# Patient Record
Sex: Male | Born: 1945 | Race: White | Hispanic: No | Marital: Married | Smoking: Never smoker
Health system: Southern US, Community
[De-identification: ages and names within clinical notes are randomized; demographics above are authoritative.]

## PROBLEM LIST (undated history)

## (undated) DIAGNOSIS — I499 Cardiac arrhythmia, unspecified: Secondary | ICD-10-CM

## (undated) DIAGNOSIS — I4891 Unspecified atrial fibrillation: Secondary | ICD-10-CM

## (undated) DIAGNOSIS — M1990 Unspecified osteoarthritis, unspecified site: Secondary | ICD-10-CM

## (undated) DIAGNOSIS — I639 Cerebral infarction, unspecified: Secondary | ICD-10-CM

## (undated) DIAGNOSIS — I10 Essential (primary) hypertension: Secondary | ICD-10-CM

## (undated) DIAGNOSIS — D649 Anemia, unspecified: Secondary | ICD-10-CM

## (undated) DIAGNOSIS — I209 Angina pectoris, unspecified: Secondary | ICD-10-CM

## (undated) DIAGNOSIS — R0600 Dyspnea, unspecified: Secondary | ICD-10-CM

## (undated) HISTORY — PX: CARPAL TUNNEL RELEASE: SHX101

---

## 1998-01-24 HISTORY — PX: HERNIA REPAIR: SHX51

## 2013-06-30 ENCOUNTER — Ambulatory Visit: Payer: Self-pay | Admitting: Emergency Medicine

## 2015-12-11 ENCOUNTER — Encounter: Payer: Self-pay | Admitting: Emergency Medicine

## 2015-12-11 ENCOUNTER — Observation Stay: Payer: Medicare HMO

## 2015-12-11 ENCOUNTER — Emergency Department: Payer: Medicare HMO

## 2015-12-11 ENCOUNTER — Observation Stay
Admission: EM | Admit: 2015-12-11 | Discharge: 2015-12-12 | Disposition: A | Discharge status: Home / Self Care | Payer: Medicare HMO | Attending: Internal Medicine | Admitting: Internal Medicine

## 2015-12-11 DIAGNOSIS — Z7901 Long term (current) use of anticoagulants: Secondary | ICD-10-CM | POA: Insufficient documentation

## 2015-12-11 DIAGNOSIS — Z66 Do not resuscitate: Secondary | ICD-10-CM | POA: Insufficient documentation

## 2015-12-11 DIAGNOSIS — I10 Essential (primary) hypertension: Secondary | ICD-10-CM | POA: Diagnosis not present

## 2015-12-11 DIAGNOSIS — G459 Transient cerebral ischemic attack, unspecified: Secondary | ICD-10-CM | POA: Diagnosis not present

## 2015-12-11 DIAGNOSIS — I340 Nonrheumatic mitral (valve) insufficiency: Secondary | ICD-10-CM | POA: Diagnosis not present

## 2015-12-11 DIAGNOSIS — I482 Chronic atrial fibrillation: Secondary | ICD-10-CM | POA: Insufficient documentation

## 2015-12-11 DIAGNOSIS — Z823 Family history of stroke: Secondary | ICD-10-CM | POA: Insufficient documentation

## 2015-12-11 DIAGNOSIS — R509 Fever, unspecified: Secondary | ICD-10-CM

## 2015-12-11 DIAGNOSIS — R200 Anesthesia of skin: Secondary | ICD-10-CM | POA: Diagnosis present

## 2015-12-11 DIAGNOSIS — I429 Cardiomyopathy, unspecified: Secondary | ICD-10-CM | POA: Diagnosis not present

## 2015-12-11 DIAGNOSIS — N179 Acute kidney failure, unspecified: Secondary | ICD-10-CM | POA: Diagnosis not present

## 2015-12-11 DIAGNOSIS — Z7982 Long term (current) use of aspirin: Secondary | ICD-10-CM | POA: Diagnosis not present

## 2015-12-11 DIAGNOSIS — Z8249 Family history of ischemic heart disease and other diseases of the circulatory system: Secondary | ICD-10-CM | POA: Diagnosis not present

## 2015-12-11 HISTORY — DX: Essential (primary) hypertension: I10

## 2015-12-11 HISTORY — DX: Unspecified atrial fibrillation: I48.91

## 2015-12-11 LAB — URINALYSIS COMPLETE WITH MICROSCOPIC (ARMC ONLY)
BACTERIA UA: NONE SEEN
Bacteria, UA: NONE SEEN
Bilirubin Urine: NEGATIVE
Bilirubin Urine: NEGATIVE
Glucose, UA: NEGATIVE mg/dL
Glucose, UA: NEGATIVE mg/dL
Hgb urine dipstick: NEGATIVE
KETONES UR: NEGATIVE mg/dL
Ketones, ur: NEGATIVE mg/dL
Leukocytes, UA: NEGATIVE
Leukocytes, UA: NEGATIVE
Nitrite: NEGATIVE
Nitrite: NEGATIVE
PROTEIN: NEGATIVE mg/dL
Protein, ur: NEGATIVE mg/dL
SQUAMOUS EPITHELIAL / LPF: NONE SEEN
Specific Gravity, Urine: 1.01 (ref 1.005–1.030)
Specific Gravity, Urine: 1.002 — ABNORMAL LOW (ref 1.005–1.030)
Squamous Epithelial / HPF: NONE SEEN
pH: 6 (ref 5.0–8.0)
pH: 7 (ref 5.0–8.0)

## 2015-12-11 LAB — TROPONIN I: Troponin I: <0.03 ng/mL (ref <0.03)

## 2015-12-11 LAB — CBC
HCT: 43.1 % (ref 40.0–52.0)
Hemoglobin: 14.3 g/dL (ref 13.0–18.0)
MCH: 31.2 pg (ref 26.0–34.0)
MCHC: 33.1 g/dL (ref 32.0–36.0)
MCV: 94.2 fL (ref 80.0–100.0)
Platelets: 222 K/uL (ref 150–440)
RBC: 4.57 MIL/uL (ref 4.40–5.90)
RDW: 13.4 % (ref 11.5–14.5)
WBC: 6.4 K/uL (ref 3.8–10.6)

## 2015-12-11 LAB — BASIC METABOLIC PANEL WITH GFR
Anion gap: 5 (ref 5–15)
BUN: 15 mg/dL (ref 6–20)
CO2: 27 mmol/L (ref 22–32)
Calcium: 8.8 mg/dL — ABNORMAL LOW (ref 8.9–10.3)
Chloride: 106 mmol/L (ref 101–111)
Creatinine, Ser: 1.3 mg/dL — ABNORMAL HIGH (ref 0.61–1.24)
GFR calc Af Amer: >60 mL/min
GFR calc non Af Amer: 54 mL/min — ABNORMAL LOW
Glucose, Bld: 91 mg/dL (ref 65–99)
Potassium: 4.2 mmol/L (ref 3.5–5.1)
Sodium: 138 mmol/L (ref 135–145)

## 2015-12-11 LAB — PROTIME-INR
INR: 2.1
Prothrombin Time: 23.9 seconds — ABNORMAL HIGH (ref 11.4–15.2)

## 2015-12-11 LAB — APTT: aPTT: 36 s (ref 24–36)

## 2015-12-11 MED ORDER — OXYBUTYNIN CHLORIDE 5 MG PO TABS
5.0000 mg | ORAL_TABLET | Freq: Once | ORAL | Status: AC
  Administered 2015-12-11: 22:00:00 5 mg via ORAL
  Filled 2015-12-11: qty 1

## 2015-12-11 MED ORDER — STROKE: EARLY STAGES OF RECOVERY BOOK
Freq: Once | Status: AC
  Administered 2015-12-11: 17:00:00

## 2015-12-11 MED ORDER — LISINOPRIL 20 MG PO TABS
20.0000 mg | ORAL_TABLET | Freq: Every day | ORAL | Status: DC
  Administered 2015-12-11: 17:00:00 20 mg via ORAL
  Filled 2015-12-11 (×2): qty 1

## 2015-12-11 MED ORDER — HYDRALAZINE HCL 20 MG/ML IJ SOLN: 10.0000 mg | Freq: Four times a day (QID) | INTRAMUSCULAR | Status: DC | PRN

## 2015-12-11 MED ORDER — RIVAROXABAN 20 MG PO TABS
20.0000 mg | ORAL_TABLET | Freq: Every day | ORAL | Status: DC
  Filled 2015-12-11: qty 1

## 2015-12-11 MED ORDER — ASPIRIN 300 MG RE SUPP: 300.0000 mg | Freq: Every day | RECTAL | Status: DC

## 2015-12-11 MED ORDER — SODIUM CHLORIDE 0.9 % IV SOLN
INTRAVENOUS | Status: DC
  Administered 2015-12-11 – 2015-12-12 (×2): via INTRAVENOUS

## 2015-12-11 MED ORDER — HYDRALAZINE HCL 20 MG/ML IJ SOLN
10.0000 mg | Freq: Once | INTRAMUSCULAR | Status: AC
  Administered 2015-12-11: 10 mg via INTRAVENOUS
  Filled 2015-12-11: qty 1

## 2015-12-11 MED ORDER — ENOXAPARIN SODIUM 40 MG/0.4ML ~~LOC~~ SOLN: 40.0000 mg | SUBCUTANEOUS | Status: DC

## 2015-12-11 MED ORDER — SALINE SPRAY 0.65 % NA SOLN
1.0000 | NASAL | Status: DC | PRN
  Administered 2015-12-11: 22:00:00 1 via NASAL
  Filled 2015-12-11: qty 44

## 2015-12-11 MED ORDER — SENNOSIDES-DOCUSATE SODIUM 8.6-50 MG PO TABS: 1.0000 | ORAL_TABLET | Freq: Every evening | ORAL | Status: DC | PRN

## 2015-12-11 MED ORDER — ASPIRIN 325 MG PO TABS
325.0000 mg | ORAL_TABLET | Freq: Every day | ORAL | Status: DC
  Administered 2015-12-12: 12:00:00 325 mg via ORAL
  Filled 2015-12-11: qty 1

## 2015-12-11 MED ORDER — ASPIRIN 81 MG PO CHEW
324.0000 mg | CHEWABLE_TABLET | Freq: Once | ORAL | Status: AC
  Administered 2015-12-11: 324 mg via ORAL
  Filled 2015-12-11: qty 4

## 2015-12-11 NOTE — Progress Notes (Signed)
ANTICOAGULATION CONSULT NOTE - Initial Consult  Pharmacy Consult for Rivaroxaban (Xarelto) Indication: atrial fibrillation  No Known Allergies  Patient Measurements: Height: 5\' 7"  (170.2 cm) Weight: 197 lb (89.4 kg) IBW/kg (Calculated) : 66.1 Heparin Dosing Weight:  Vital Signs: Temp: 97.6 F (36.4 C) (11/17 1610) Temp Source: Oral (11/17 1215) BP: 151/101 (11/17 1610) Pulse Rate: 64 (11/17 1610)  Labs:  Recent Labs  12/11/15 1224  HGB 14.3  HCT 43.1  PLT 222  APTT 36  LABPROT 23.9*  INR 2.10  CREATININE 1.30*  TROPONINI <0.03    Estimated Creatinine Clearance: 56.4 mL/min (by C-G formula based on SCr of 1.3 mg/dL (H)).   Medical History: Past Medical History:  Diagnosis Date  . Atrial fibrillation (HCC)   . Hypertension     Medications:  Scheduled:  .  stroke: mapping our early stages of recovery book   Does not apply Once  . aspirin  300 mg Rectal Daily   Or  . aspirin  325 mg Oral Daily  . hydrALAZINE  10 mg Intravenous Once  . lisinopril  20 mg Oral Daily  . rivaroxaban  20 mg Oral Q supper    Assessment: 70 yo M admitted with L arm weakness, numbness- evaluate for TIA.  Hx Chronic Afib on Xarelto 20mg  daily at home. Patient Crcl with Total body weight of 89.4kg = 67 ml/min.  Plan:  Will continue patient on Xarelto (Rivaroxaban) 20mg  po daily with evening meal.  (Note that Rivaroxaban will affect the INR result, but the INR is not used to dose medication)   Meaghen Vecchiarelli A 12/11/2015,4:57 PM

## 2015-12-11 NOTE — ED Provider Notes (Signed)
[facility] Emergency Department Provider Note  ____________________________________________  Time seen: Approximately 12:48 PM  I have reviewed the triage vital signs and the nursing notes.   HISTORY  Chief Complaint Numbness and Dizziness   HPI Thomas KillingsJohn G Newman is a 70 y.o. male history of atrial fibrillation on Xarelto, hypertension, migraineswho presents for evaluation of left sided numbness. Patient reports multiple weekly episodes of left upper and lower extremity numbness and weakness. Patient reports that these episodes have been going on for 2-3 weeks. The first one lasted about 5 minutes and all the following once last a few minutes at a time. They're associated with dizziness but no headache. Patient reports that the last episode was earlier this morning. Had an appointment with Dr. Sherryll BurgerShah, neurology who sent him to the emergency room for further evaluation as patient's last episode was this am at 10:30. Patient reports no numbness or weakness at this time. Patient denies personal or family history of strokes. He is not a smoker. Patient denies facial droop, slurred speech with these episodes.  Past Medical History:  Diagnosis Date  . Atrial fibrillation (HCC)   . Hypertension     There are no active problems to display for this patient.   Past Surgical History:  Procedure Laterality Date  . CARPAL TUNNEL RELEASE    . HERNIA REPAIR      Prior to Admission medications   Not on File    Allergies Patient has no known allergies.  History reviewed. No pertinent family history.  Social History Social History  Substance Use Topics  . Smoking status: Never Smoker  . Smokeless tobacco: Never Used  . Alcohol use Yes    Review of Systems  Constitutional: Negative for fever. Eyes: Negative for visual changes. ENT: Negative for sore throat. Neck: No neck pain  Cardiovascular: Negative for chest pain. Respiratory: Negative for shortness of  breath. Gastrointestinal: Negative for abdominal pain, vomiting or diarrhea. Genitourinary: Negative for dysuria. Musculoskeletal: Negative for back pain. Skin: Negative for rash. Neurological: Negative for headaches. + dizziness, LU and LLE weakness and numbness Psych: No SI or HI  ____________________________________________   PHYSICAL EXAM:  VITAL SIGNS: ED Triage Vitals [12/11/15 1215]  Enc Vitals Group     BP (!) 182/89     Pulse Rate (!) 39     Resp 18     Temp 97.9 F (36.6 C)     Temp Source Oral     SpO2 98 %     Weight 197 lb (89.4 kg)     Height 5\' 7"  (1.702 m)     Head Circumference      Peak Flow      Pain Score      Pain Loc      Pain Edu?      Excl. in GC?     Constitutional: Alert and oriented. Well appearing and in no apparent distress. HEENT:      Head: Normocephalic and atraumatic.         Eyes: Conjunctivae are normal. Sclera is non-icteric. EOMI. PERRL      Mouth/Throat: Mucous membranes are moist.       Neck: Supple with no signs of meningismus. Cardiovascular: Regular rate and rhythm. No murmurs, gallops, or rubs. 2+ symmetrical distal pulses are present in all extremities. No JVD. Respiratory: Normal respiratory effort. Lungs are clear to auscultation bilaterally. No wheezes, crackles, or rhonchi.  Gastrointestinal: Soft, non tender, and non distended with positive bowel sounds.  No rebound or guarding. Musculoskeletal: Nontender with normal range of motion in all extremities. No edema, cyanosis, or erythema of extremities. Neurologic: Normal speech and language. A & O x3, PERRL, no nystagmus, CN II-XII intact, motor testing reveals good tone and bulk throughout. There is no evidence of pronator drift or dysmetria. Muscle strength is 5/5 throughout. Deep tendon reflexes are 2+ throughout with downgoing toes. Sensory examination is intact. Gait is normal. Skin: Skin is warm, dry and intact. No rash noted. Psychiatric: Mood and affect are normal.  Speech and behavior are normal.  ____________________________________________   LABS (all labs ordered are listed, but only abnormal results are displayed)  Labs Reviewed  URINALYSIS COMPLETEWITH MICROSCOPIC ([facility] ONLY) - Abnormal; Notable for the following:       Result Value   Color, Urine YELLOW (*)    APPearance CLEAR (*)    All other components within normal limits  PROTIME-INR - Abnormal; Notable for the following:    Prothrombin Time 23.9 (*)    All other components within normal limits  CBC  APTT  BASIC METABOLIC PANEL  TROPONIN I   ____________________________________________  EKG  ED ECG REPORT I, Nita Sicklearolina Faria Casella, the attending physician, personally viewed and interpreted this ECG.  Atrial fibrillation with occasional PVCs, rate of 70, normal QRS and QTc intervals, normal axis, no ST elevations or depressions.  ____________________________________________  RADIOLOGY  Head CT: negative  ____________________________________________   PROCEDURES  Procedure(s) performed: None Procedures Critical Care performed:  None ____________________________________________   INITIAL IMPRESSION / ASSESSMENT AND PLAN / ED COURSE   70 y.o. male history of atrial fibrillation on Xarelto, hypertension, migraineswho presents for evaluation of multiple weekly episodes of LU and LLE weakness and numbness with last one happening this morning at 10:30AM. Patient is currently neuro intact. Sent here by Dr. Sherryll BurgerShah neurology. Head CT with no acute findings. Will consult neurology for admission for TIA work up. Will give ASA.  Clinical Course     Pertinent labs & imaging results that were available during my care of the patient were reviewed by me and considered in my medical decision making (see chart for details).    ____________________________________________   FINAL CLINICAL IMPRESSION(S) / ED DIAGNOSES  Final diagnoses:  Transient cerebral ischemia, unspecified type        NEW MEDICATIONS STARTED DURING THIS VISIT:  New Prescriptions   No medications on file     Note:  This document was prepared using Dragon voice recognition software and may include unintentional dictation errors.    Nita Sicklearolina Ahmira Boisselle, MD 12/11/15 1316

## 2015-12-11 NOTE — H&P (Addendum)
[facility] [place] [facility]   PATIENT NAME: Thomas Newman    MR#:  [id number]  DATE OF BIRTH:  09/03/1945  DATE OF ADMISSION:  12/11/2015  PRIMARY CARE PHYSICIAN: BLISS, Doreene Nest, MD   REQUESTING/REFERRING PHYSICIAN:  Dr Don Perking  CHIEF COMPLAINT:   Left hand numbness with feelings of lightheadedness HISTORY OF PRESENT ILLNESS:  Gwyn Mehring  is a 70 y.o. male with a known history of  Atrial fibrillation on anticoagulation who presents with above complaint. Over the past 3 weeks patient has had several episodes of left hand numbness with feelings of lightheadedness. He has also had some spells of left lower extremity numbness and weakness. There are no other neurological deficits such as a facial or facial droop. These episodes last 2-5 minutes. They have occurred 2-3 times per week. He is compliant with his medications including XARELTO which she started several months ago for chronic atrial fibrillation. Patient reported this to his cardiologist approximately a week ago and was referred to neurologist for further evaluation. Patient was evaluated by neurologist this morning and was sent to the emergency room for further evaluation. Patient has no symptoms while in the emergency room however does report that this a.m. he had numbness in the left arm. He denies trauma to the cervical neck or recent falls.  PAST MEDICAL HISTORY:   Past Medical History:  Diagnosis Date  . Atrial fibrillation (HCC)   . Hypertension     PAST SURGICAL HISTORY:   Past Surgical History:  Procedure Laterality Date  . CARPAL TUNNEL RELEASE    . HERNIA REPAIR      SOCIAL HISTORY:   Social History  Substance Use Topics  . Smoking status: Never Smoker  . Smokeless tobacco: Never Used  . Alcohol use Yes    FAMILY HISTORY:   Positive for CAD and CVA DRUG ALLERGIES:  No Known Allergies  REVIEW OF SYSTEMS:   Review of Systems  Constitutional: Negative.  Negative for chills,  fever and malaise/fatigue.  HENT: Negative.  Negative for ear discharge, ear pain, hearing loss, nosebleeds and sore throat.   Eyes: Negative.  Negative for blurred vision and pain.  Respiratory: Negative.  Negative for cough, hemoptysis, shortness of breath and wheezing.   Cardiovascular: Negative.  Negative for chest pain, palpitations and leg swelling.  Gastrointestinal: Negative.  Negative for abdominal pain, blood in stool, diarrhea, nausea and vomiting.  Genitourinary: Negative.  Negative for dysuria.  Musculoskeletal: Negative.  Negative for back pain.  Skin: Negative.   Neurological: Positive for sensory change and focal weakness. Negative for dizziness, tremors, speech change, seizures and headaches.  Endo/Heme/Allergies: Negative.  Does not bruise/bleed easily.  Psychiatric/Behavioral: Negative.  Negative for depression, hallucinations and suicidal ideas.    MEDICATIONS AT HOME:   Prior to Admission medications   Medication Sig Start Date End Date Taking? Authorizing Provider  Multiple Vitamins-Minerals (SENIOR VITES) TBCR Take 1 tablet by mouth daily.   Yes Historical Provider, MD  Omega-3 Fatty Acids (FISH OIL PO) Take 1,400 tablets by mouth daily. 08/12/08  Yes Historical Provider, MD  rivaroxaban (XARELTO) 20 MG TABS tablet Take 20 tablets by mouth daily. 09/03/15  Yes Historical Provider, MD  aspirin EC 81 MG tablet Take 1 tablet by mouth daily.    Historical Provider, MD  cloNIDine (CATAPRES) 0.1 MG tablet Take 0.1 mg by mouth 2 (two) times daily. 12/02/15   Historical Provider, MD      VITAL SIGNS:  Blood pressure (!) 147/101,  pulse (!) 45, temperature 97.9 F (36.6 C), resp. rate 19, height 5\' 7"  (1.702 m), weight 89.4 kg (197 lb), SpO2 99 %.  PHYSICAL EXAMINATION:   Physical Exam  Constitutional: He is oriented to person, place, and time and well-developed, well-nourished, and in no distress. No distress.  HENT:  Head: Normocephalic.  Eyes: No scleral icterus.   Neck: Normal range of motion. Neck supple. No JVD present. No tracheal deviation present.  Cardiovascular: Normal rate.  Exam reveals no gallop and no friction rub.   Murmur heard. Irregular, irregular  Pulmonary/Chest: Effort normal and breath sounds normal. No respiratory distress. He has no wheezes. He has no rales. He exhibits no tenderness.  Abdominal: Soft. Bowel sounds are normal. He exhibits no distension and no mass. There is no tenderness. There is no rebound and no guarding.  Musculoskeletal: Normal range of motion. He exhibits no edema.  Neurological: He is alert and oriented to person, place, and time.  Skin: Skin is warm. No rash noted. No erythema.  Psychiatric: Affect and judgment normal.      LABORATORY PANEL:   CBC  Recent Labs Lab 12/11/15 1224  WBC 6.4  HGB 14.3  HCT 43.1  PLT 222   ------------------------------------------------------------------------------------------------------------------  Chemistries   Recent Labs Lab 12/11/15 1224  NA 138  K 4.2  CL 106  CO2 27  GLUCOSE 91  BUN 15  CREATININE 1.30*  CALCIUM 8.8*   ------------------------------------------------------------------------------------------------------------------  Cardiac Enzymes  Recent Labs Lab 12/11/15 1224  TROPONINI <0.03   ------------------------------------------------------------------------------------------------------------------  RADIOLOGY:  Ct Head Wo Contrast  Result Date: 12/11/2015 CLINICAL DATA:  Pt states numbness in left arm Pt dizzy And light headedPt states started in october EXAM: CT HEAD WITHOUT CONTRAST TECHNIQUE: Contiguous axial images were obtained from the base of the skull through the vertex without intravenous contrast. COMPARISON:  None. FINDINGS: Brain: No evidence of acute infarction, hemorrhage, hydrocephalus, extra-axial collection or mass lesion/mass effect. Vascular: No hyperdense vessel or unexpected calcification. Skull:  Normal. Negative for fracture or focal lesion. Sinuses/Orbits: Visualized upper maxillary sinuses are opacified. Ethmoid, frontal and sphenoid sinuses are clear. Clear mastoid air cells. Globes and orbits are unremarkable. Other: Small calcified left parietal scalp lesion, incidental. IMPRESSION: 1. No acute intracranial abnormalities. 2. Opacified maxillary sinuses, incompletely imaged. Electronically Signed   By: Amie Portlandavid  Ormond M.D.   On: 12/11/2015 13:02    EKG:   Atrial fibrillation without ST elevation or depression  IMPRESSION AND PLAN:   70 year old male with chronic atrial fibrillation on anticoagulation who presents with several weeks of left arm numbness and weakness.  1. Left arm numbness and weakness: Patient will be admitted to evaluate for TIA. Carotid Doppler, MRI and MRA of the brain have been ordered. Neurology consultation has been requested. I will continue outpatient medications including aspirin and Xarelto. I will also order x-ray cervical neck to exclude cervical etiology.  2. Chronic atrial fibrillation on Xarelto: Patient is followed by Dr. Juliann Paresallwood. Consideration for beta blocker or calcium channel blocker per last visit Currently heart rates in the 60's  3. Essential hypertension: Continue clonidine and will replace HCTZ with lisinopril  4. Cardiac murmur likely due to valvular regurgitation. 5. Cardiomyopathy EF of 40-45% by echocardiogram Start lisinopril. Consider beta blocker if heart rate tolerates. 6. AKI: Hold HCTZ and monitor closely while on ACEI.  All the records are reviewed and case discussed with ED provider. Management plans discussed with the patient and he in agreement  CODE STATUS: DNR TOTAL TIME  TAKING CARE OF THIS PATIENT: 45 minutes.    Gara Kincade M.D on 12/11/2015 at 2:17 PM  Between 7am to 6pm - Pager - [phone]  After 6pm go to [email or web address] - Social research officer, governmentpassword [email or web address] [email or web address]  [facility]  Office   [phone]  CC: Primary care physician; BLISS, Doreene NestLAURA K, MD

## 2015-12-11 NOTE — ED Triage Notes (Signed)
Pt reports dizziness left arm/leg numbness/tingling on and off for 2-3 weeks. Denies any pain. No hx stroke.

## 2015-12-11 NOTE — Progress Notes (Signed)
Family Meeting Note  Advance Directive:yes  Today a meeting took place with the spouse.   The following clinical team members were present during this meeting:  The following were discussed:Patient's diagnosis:  numbness r/o TIA , Patient's progosis: Unable to determine and Goals for treatment: DNR  Additional follow-up to be provided: patient is DNR   Time spent during discussion:16 minutes  Anjelika Ausburn, MD

## 2015-12-11 NOTE — Care Management Obs Status (Signed)
MEDICARE OBSERVATION STATUS NOTIFICATION   Patient Details  Name: Thomas Newman MRN: [id number] Date of Birth: 1945-03-03   Medicare Observation Status Notification Given:  Yes    Berna BueCheryl Mulan Adan, RN 12/11/2015, 2:13 PM

## 2015-12-12 ENCOUNTER — Observation Stay: Payer: Medicare HMO

## 2015-12-12 ENCOUNTER — Observation Stay
Admit: 2015-12-12 | Discharge: 2015-12-12 | Disposition: A | Discharge status: Home / Self Care | Payer: Medicare HMO | Attending: Internal Medicine | Admitting: Internal Medicine

## 2015-12-12 DIAGNOSIS — G459 Transient cerebral ischemic attack, unspecified: Secondary | ICD-10-CM | POA: Diagnosis not present

## 2015-12-12 LAB — LIPID PANEL
CHOLESTEROL: 155 mg/dL (ref 0–200)
HDL: 50 mg/dL (ref >40)
LDL Cholesterol: 92 mg/dL (ref 0–99)
Total CHOL/HDL Ratio: 3.1 RATIO
Triglycerides: 67 mg/dL (ref <150)
VLDL: 13 mg/dL (ref 0–40)

## 2015-12-12 MED ORDER — ATORVASTATIN CALCIUM 40 MG PO TABS: 40.0000 mg | ORAL_TABLET | Freq: Every day | ORAL | 0 refills | Status: DC

## 2015-12-12 MED ORDER — ACETAMINOPHEN 325 MG PO TABS
650.0000 mg | ORAL_TABLET | Freq: Four times a day (QID) | ORAL | Status: DC | PRN
  Administered 2015-12-12 (×2): 650 mg via ORAL
  Filled 2015-12-12 (×2): qty 2

## 2015-12-12 MED ORDER — CLONIDINE HCL 0.1 MG PO TABS: 0.1000 mg | ORAL_TABLET | Freq: Two times a day (BID) | ORAL | Status: DC

## 2015-12-12 MED ORDER — CLONIDINE HCL 0.1 MG PO TABS
0.2000 mg | ORAL_TABLET | Freq: Once | ORAL | Status: AC
  Administered 2015-12-12: 13:00:00 0.2 mg via ORAL
  Filled 2015-12-12: qty 2

## 2015-12-12 NOTE — Progress Notes (Signed)
*  PRELIMINARY RESULTS* Echocardiogram 2D Echocardiogram has been performed.  Thomas Newman 12/12/2015, 11:28 AM

## 2015-12-12 NOTE — Evaluation (Signed)
Occupational Therapy Evaluation Patient Details Name: Thomas Newman MRN: [id number] DOB: 1945-08-22 Today's Date: 12/12/2015    History of Present Illness 70 y.o. male pt. with a known history of  Atrial fibrillation on anticoagulation who presents with complaint - over the past 3 weeks patient has had several episodes of left hand numbness with feelings of lightheadedness. He has also had some spells of left lower extremity numbness and weakness.    Clinical Impression   Pt presents with no reported pain and no functional deficits in self care tasks and reports that symptoms have resolved. Pt's house is well set up with AE/DME and pt will have support from wife at home. No additional OT needs at this time, no equipment recommended. Will d/c patient from OT services.    Follow Up Recommendations  No OT follow up    Equipment Recommendations  None recommended by OT    Recommendations for Other Services       Precautions / Restrictions Precautions Precautions: None Restrictions Weight Bearing Restrictions: No      Mobility Bed Mobility Overal bed mobility: Independent             General bed mobility comments: Independent  Transfers Overall transfer level: Independent Equipment used: None             General transfer comment:  (Independent without AD)    Balance Overall balance assessment: Independent;No apparent balance deficits (not formally assessed)                                          ADL Overall ADL's : Independent                                       General ADL Comments: Pt able to perform all basic self care tasks independently and at baseline levels     Vision Vision Assessment?: No apparent visual deficits   Perception     Praxis      Pertinent Vitals/Pain Pain Assessment: No/denies pain     Hand Dominance     Extremity/Trunk Assessment Upper Extremity Assessment Upper Extremity  Assessment: Overall WFL for tasks assessed   Lower Extremity Assessment Lower Extremity Assessment: Overall WFL for tasks assessed   Cervical / Trunk Assessment Cervical / Trunk Assessment: Normal   Communication Communication Communication: No difficulties   Cognition Arousal/Alertness: Awake/alert Behavior During Therapy: WFL for tasks assessed/performed Overall Cognitive Status: Within Functional Limits for tasks assessed                     General Comments       Exercises       Shoulder Instructions      Home Living Family/patient expects to be discharged to:: Private residence Living Arrangements: Spouse/significant other Available Help at Discharge: Family Type of Home: House Home Access: Stairs to enter Entergy CorporationEntrance Stairs-Number of Steps: 3 steps with no railing in front; 4 steps with railing in the back   Home Layout: One level     Bathroom Shower/Tub: Walk-in shower;Curtain (built in shower seat, handheld shower head)   Bathroom Toilet: Handicapped height Bathroom Accessibility: Yes   Home Equipment: Environmental consultantWalker - 4 wheels;Bedside commode;Cane - single point;Shower seat - built in;Adaptive equipment;Hand held shower head;Grab bars - tub/shower;Grab bars -  toilet Adaptive Equipment: Reacher;Sock aid;Long-handled shoe horn        Prior Functioning/Environment Level of Independence: Independent                 OT Problem List:     OT Treatment/Interventions:      OT Goals(Current goals can be found in the care plan section) Acute Rehab OT Goals Patient Stated Goal: to go home OT Goal Formulation: With patient/family Time For Goal Achievement: 12/26/15 Potential to Achieve Goals: Good  OT Frequency:     Barriers to D/C:            Co-evaluation              End of Session    Activity Tolerance: Patient tolerated treatment well Patient left: in bed;with call bell/phone within reach   Time: [id number] OT Time Calculation (min): 8  min Charges:  OT General Charges $OT Visit: 1 Procedure OT Evaluation $OT Eval Low Complexity: 1 Procedure G-Codes: OT G-codes **NOT FOR INPATIENT CLASS** Functional Assessment Tool Used: clinical judgment Functional Limitation: Self care Self Care Current Status (V4098(G8987): 0 percent impaired, limited or restricted Self Care Goal Status (J1914(G8988): 0 percent impaired, limited or restricted Self Care Discharge Status (N8295(G8989): 0 percent impaired, limited or restricted  Eliezer BottomJamie L Stiller, OTR/L 12/12/2015, 1:02 PM

## 2015-12-12 NOTE — Progress Notes (Signed)
SLP Cancellation Note  Patient Details Name: Thomas KillingsJohn G Newman MRN: [id number] DOB: 1946-01-23   Cancelled treatment:       Reason Eval/Treat Not Completed: SLP screened, no needs identified, will sign off Reviewed chart and spoke w/nsg and pt/family. Pt is a 70 y.o. male with a known history of  Atrial fibrillation on anticoagulation who presents with several episodes of left hand numbness with feelings of lightheadedness. He has also had some spells of left lower extremity numbness and weakness. There are no other neurological deficits such as a facial or facial droop. These episodes last 2-5 minutes. They have occurred 2-3 times per week. Pt able to converse appropriately with ST. No dysarthria or word finding difficulties noted. Pt/family and nsg deny any swallowing difficulties. Pt reports he is at baseline. No Skilled ST services indicated at this time. Please re-consult if further concerns arise.   Truxton,Mishael Haran 12/12/2015, 12:09 PM

## 2015-12-12 NOTE — Consult Note (Signed)
Referring Physician: Renae Gloss    Chief Complaint: Left sided weakness/numbness  HPI: Thomas Newman is an 70 y.o. male who reports that on October 30 he began to have episodes of left sided numbness and weakness.  There is associated dizziness as well.  Symptoms appear to always involve the arm but may also involve the leg and trunk at times as well.  Episodes last only a few minutes then resolve spontaneously.  Initially they occurred about 2-3 times per week but the last two were a week apart.  Patient as to have a neurology appointment on yesterday and had an episode on the way to his appointment.  Was sent for admission on arrival.  Initial NIHSS of 0.    Date last known well: Date: 12/11/2015 Time last known well: Time: 10:30 tPA Given: No: resolution of symptoms  Past Medical History:  Diagnosis Date  . Atrial fibrillation (HCC)   . Hypertension     Past Surgical History:  Procedure Laterality Date  . CARPAL TUNNEL RELEASE    . HERNIA REPAIR      Family history: Father with HTN.  Maternal grandparents with stroke.  Paternal grandparents with CAD.  Brothers alive and well.  Two sisters with breast cancer.    Social History:  reports that he has never smoked. He has never used smokeless tobacco. He reports that he drinks alcohol. He reports that he does not use drugs.  Allergies: No Known Allergies  Medications:  I have reviewed the patient's current medications. Prior to Admission:  Prescriptions Prior to Admission  Medication Sig Dispense Refill Last Dose  . Multiple Vitamins-Minerals (SENIOR VITES) TBCR Take 1 tablet by mouth daily.   12/11/2015 at 0800  . Omega-3 Fatty Acids (FISH OIL PO) Take 1,400 tablets by mouth daily.   12/11/2015 at 0800  . rivaroxaban (XARELTO) 20 MG TABS tablet Take 20 tablets by mouth daily.   12/11/2015 at 0800  . aspirin EC 81 MG tablet Take 1 tablet by mouth daily.   12/11/2015 at 0800  . cloNIDine (CATAPRES) 0.1 MG tablet Take 0.1 mg by mouth 2  (two) times daily.   12/11/2015 at 0800   Scheduled: . aspirin  300 mg Rectal Daily   Or  . aspirin  325 mg Oral Daily  . lisinopril  20 mg Oral Daily  . rivaroxaban  20 mg Oral Q supper    ROS: History obtained from the patient  General ROS: negative for - chills, fatigue, fever, night sweats, weight gain or weight loss Psychological ROS: negative for - behavioral disorder, hallucinations, memory difficulties, mood swings or suicidal ideation Ophthalmic ROS: negative for - blurry vision, double vision, eye pain or loss of vision ENT ROS: negative for - epistaxis, nasal discharge, oral lesions, sore throat, tinnitus or vertigo Allergy and Immunology ROS: negative for - hives or itchy/watery eyes Hematological and Lymphatic ROS: negative for - bleeding problems, bruising or swollen lymph nodes Endocrine ROS: negative for - galactorrhea, hair pattern changes, polydipsia/polyuria or temperature intolerance Respiratory ROS: negative for - cough, hemoptysis, shortness of breath or wheezing Cardiovascular ROS: negative for - chest pain, dyspnea on exertion, edema or irregular heartbeat Gastrointestinal ROS: negative for - abdominal pain, diarrhea, hematemesis, nausea/vomiting or stool incontinence Genito-Urinary ROS: negative for - dysuria, hematuria, incontinence or urinary frequency/urgency Musculoskeletal ROS: negative for - joint swelling or muscular weakness Neurological ROS: as noted in HPI Dermatological ROS: negative for rash and skin lesion changes  Physical Examination: Blood pressure (!) 146/76,  pulse 60, temperature 99.2 F (37.3 C), temperature source Oral, resp. rate 18, height 5\' 7"  (1.702 m), weight 89.4 kg (197 lb), SpO2 97 %.  HEENT-  Normocephalic, no lesions, without obvious abnormality.  Normal external eye and conjunctiva.  Normal TM's bilaterally.  Normal auditory canals and external ears. Normal external nose, mucus membranes and septum.  Normal  pharynx. Cardiovascular- S1, S2 normal, pulses palpable throughout   Lungs- chest clear, no wheezing, rales, normal symmetric air entry Abdomen- soft, non-tender; bowel sounds normal; no masses,  no organomegaly Extremities- no edema Lymph-no adenopathy palpable Musculoskeletal-no joint tenderness, deformity or swelling Skin-warm and dry, no hyperpigmentation, vitiligo, or suspicious lesions  Neurological Examination Mental Status: Alert, oriented, thought content appropriate.  Speech fluent without evidence of aphasia.  Able to follow 3 step commands without difficulty. Cranial Nerves: II: Discs flat bilaterally; Visual fields grossly normal, pupils equal, round, reactive to light and accommodation III,IV, VI: ptosis not present, extra-ocular motions intact bilaterally V,VII: smile symmetric, facial light touch sensation normal bilaterally VIII: hearing normal bilaterally IX,X: gag reflex present XI: bilateral shoulder shrug XII: midline tongue extension Motor: Right : Upper extremity   5/5    Left:     Upper extremity   5-/5, no pronator drift  Lower extremity   5/5     Lower extremity   5/5 Tone and bulk:normal tone throughout; no atrophy noted Sensory: Pinprick and light touch intact throughout, bilaterally Deep Tendon Reflexes: 2+ and symmetric throughout Plantars: Right: downgoing   Left: downgoing Cerebellar: Normal finger-to-nose and normal heel-to-shin testing bilaterally Gait: normal gait and station   Laboratory Studies:  Basic Metabolic Panel:  Recent Labs Lab 12/11/15 1224  NA 138  K 4.2  CL 106  CO2 27  GLUCOSE 91  BUN 15  CREATININE 1.30*  CALCIUM 8.8*    Liver Function Tests: No results for input(s): AST, ALT, ALKPHOS, BILITOT, PROT, ALBUMIN in the last 168 hours. No results for input(s): LIPASE, AMYLASE in the last 168 hours. No results for input(s): AMMONIA in the last 168 hours.  CBC:  Recent Labs Lab 12/11/15 1224  WBC 6.4  HGB 14.3  HCT  43.1  MCV 94.2  PLT 222    Cardiac Enzymes:  Recent Labs Lab 12/11/15 1224  TROPONINI <0.03    BNP: Invalid input(s): POCBNP  CBG: No results for input(s): GLUCAP in the last 168 hours.  Microbiology: No results found for this or any previous visit.  Coagulation Studies:  Recent Labs  12/11/15 1224  LABPROT 23.9*  INR 2.10    Urinalysis:  Recent Labs Lab 12/11/15 1224 12/11/15 1901  COLORURINE YELLOW* COLORLESS*  LABSPEC 1.010 1.002*  PHURINE 6.0 7.0  GLUCOSEU NEGATIVE NEGATIVE  HGBUR NEGATIVE 2+*  BILIRUBINUR NEGATIVE NEGATIVE  KETONESUR NEGATIVE NEGATIVE  PROTEINUR NEGATIVE NEGATIVE  NITRITE NEGATIVE NEGATIVE  LEUKOCYTESUR NEGATIVE NEGATIVE    Lipid Panel:    Component Value Date/Time   CHOL 155 12/12/2015 0425   TRIG 67 12/12/2015 0425   HDL 50 12/12/2015 0425   CHOLHDL 3.1 12/12/2015 0425   VLDL 13 12/12/2015 0425   LDLCALC 92 12/12/2015 0425    HgbA1C: No results found for: HGBA1C  Urine Drug Screen:  No results found for: LABOPIA, COCAINSCRNUR, LABBENZ, AMPHETMU, THCU, LABBARB  Alcohol Level: No results for input(s): ETH in the last 168 hours.  Other results: EKG: atrial fibrillation, rate 70 bpm.  Imaging: Dg Chest 1 View  Result Date: 12/12/2015 CLINICAL DATA:  Left-sided numbness. EXAM: CHEST 1  VIEW COMPARISON:  None. FINDINGS: Mild cardiomegaly. Lungs are clear. No effusions or acute bony abnormality. IMPRESSION: Cardiomegaly.  No active disease. Electronically Signed   By: Charlett Nose M.D.   On: 12/12/2015 08:59   Dg Cervical Spine Complete  Result Date: 12/11/2015 CLINICAL DATA:  Left arm numbness.  No known injury. EXAM: CERVICAL SPINE - COMPLETE 4+ VIEW COMPARISON:  None. FINDINGS: Disc space narrowing and moderately large and large anterior spurs at the C3-4 through C6-7 levels. Milder posterior spur formation at those levels. There are also uncinate spurs bilaterally at those levels. Minimal foraminal stenosis on the right  at the C3-4 and C4-5 levels and mild to moderate foraminal stenosis on the right at the C5-6 and C6-7 levels. Mild foraminal stenosis on the left at the C3-4 level and moderate foraminal stenosis on the left at the C5-6 and C6-7 levels. No significant foraminal stenosis on the left at the C4-5 level. IMPRESSION: Multilevel cervical spine degenerative changes, as described above. There is moderate foraminal stenosis on the left at the C5-6 and C6-7 levels. Electronically Signed   By: Beckie Salts M.D.   On: 12/11/2015 14:42   Ct Head Wo Contrast  Result Date: 12/11/2015 CLINICAL DATA:  Pt states numbness in left arm Pt dizzy And light headedPt states started in october EXAM: CT HEAD WITHOUT CONTRAST TECHNIQUE: Contiguous axial images were obtained from the base of the skull through the vertex without intravenous contrast. COMPARISON:  None. FINDINGS: Brain: No evidence of acute infarction, hemorrhage, hydrocephalus, extra-axial collection or mass lesion/mass effect. Vascular: No hyperdense vessel or unexpected calcification. Skull: Normal. Negative for fracture or focal lesion. Sinuses/Orbits: Visualized upper maxillary sinuses are opacified. Ethmoid, frontal and sphenoid sinuses are clear. Clear mastoid air cells. Globes and orbits are unremarkable. Other: Small calcified left parietal scalp lesion, incidental. IMPRESSION: 1. No acute intracranial abnormalities. 2. Opacified maxillary sinuses, incompletely imaged. Electronically Signed   By: Amie Portland M.D.   On: 12/11/2015 13:02   Mr Brain Wo Contrast  Result Date: 12/12/2015 CLINICAL DATA:  Left-sided numbness. History of atrial fibrillation. EXAM: MRI HEAD WITHOUT CONTRAST MRA HEAD WITHOUT CONTRAST TECHNIQUE: Multiplanar, multiecho pulse sequences of the brain and surrounding structures were obtained without intravenous contrast. Angiographic images of the head were obtained using MRA technique without contrast. COMPARISON:  None. FINDINGS: MRI HEAD  FINDINGS Brain: No acute infarction, hemorrhage, hydrocephalus, extra-axial collection or mass lesion. 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 No notable ischemic change in the cerebral white matter. Vascular: Preserved flow voids.  Arterial findings below. Skull and upper cervical spine: Upper cervical degenerative disc disease. No focal marrow lesion. Sinuses/Orbits: Small bilateral maxillary sinuses with diffuse opacification. Other: Dermal inclusion cyst in left parietal scalp. MRA HEAD FINDINGS Standard vertebrobasilar branching. The left PICA origin is not covered. High-grade narrowing of the distal right vertebral artery at the vertebrobasilar junction. Basilar is smooth and widely patent. Severe stenosis with flow gap and weak downstream enhancement at the right P1 2 junction. There is atheromatous irregularity of the left P2 and P3 segments with high-grade narrowing. Symmetric carotid arteries. Proximal MCA signal is symmetrically weak due to loss of signal and direction at the end of tagging slab. No flow limiting stenosis or major branch occlusion identified. Negative for aneurysm. IMPRESSION: 1. No acute finding, including infarct. 2. 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 3. 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 4. Chronic maxillary sinusitis  with sinus atelectasis. Electronically Signed   By: Marnee SpringJonathon  Watts M.D.   On: 12/12/2015 11:07   [place] Carotid Bilateral (at [facility] And [facility] Only)  Result Date: 12/12/2015 CLINICAL DATA:  Tingling in left arm and leg for 3 weeks with dizziness EXAM: BILATERAL CAROTID DUPLEX ULTRASOUND TECHNIQUE: Wallace CullensGray scale imaging, color Doppler and duplex ultrasound were performed of bilateral carotid and vertebral arteries in the neck. COMPARISON:  None. FINDINGS: Criteria: Quantification of carotid stenosis is based on velocity parameters that correlate the  residual internal carotid diameter with NASCET-based stenosis levels, using the diameter of the distal internal carotid lumen as the denominator for stenosis measurement. The following velocity measurements were obtained: RIGHT ICA:  69 cm/sec CCA:  85 cm/sec SYSTOLIC ICA/CCA RATIO:  0.8 DIASTOLIC ICA/CCA RATIO:  0.9 ECA:  95 cm/sec LEFT ICA:  75 cm/sec CCA:  96 cm/sec SYSTOLIC ICA/CCA RATIO:  0.8 DIASTOLIC ICA/CCA RATIO:  1.4 ECA:  105 cm/sec RIGHT CAROTID ARTERY: Little if any plaque in the bulb. Low resistance internal carotid Doppler pattern. Heart rate is irregular. RIGHT VERTEBRAL ARTERY:  Antegrade. LEFT CAROTID ARTERY: Mild irregular calcified plaque is present in the bulb. Low resistance internal carotid Doppler pattern is preserved. LEFT VERTEBRAL ARTERY:  Antegrade. IMPRESSION: Less than 50% stenosis in the right and left internal carotid arteries. There is mild irregular calcified plaque in the left bulb. Irregular cardiac rhythm.  Correlation with EKG is recommended. Electronically Signed   By: Jolaine ClickArthur  Hoss M.D.   On: 12/12/2015 09:53   Mr Maxine GlennMra Head/brain WUWo Cm  Result Date: 12/12/2015 CLINICAL DATA:  Left-sided numbness. History of atrial fibrillation. EXAM: MRI HEAD WITHOUT CONTRAST MRA HEAD WITHOUT CONTRAST TECHNIQUE: Multiplanar, multiecho pulse sequences of the brain and surrounding structures were obtained without intravenous contrast. Angiographic images of the head were obtained using MRA technique without contrast. COMPARISON:  None. FINDINGS: MRI HEAD FINDINGS Brain: No acute infarction, hemorrhage, hydrocephalus, extra-axial collection or mass lesion. 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 No notable ischemic change in the cerebral white matter. Vascular: Preserved flow voids.  Arterial findings below. Skull and upper cervical spine: Upper cervical degenerative disc disease. No focal marrow lesion. Sinuses/Orbits: Small bilateral maxillary sinuses with diffuse opacification.  Other: Dermal inclusion cyst in left parietal scalp. MRA HEAD FINDINGS Standard vertebrobasilar branching. The left PICA origin is not covered. High-grade narrowing of the distal right vertebral artery at the vertebrobasilar junction. Basilar is smooth and widely patent. Severe stenosis with flow gap and weak downstream enhancement at the right P1 2 junction. There is atheromatous irregularity of the left P2 and P3 segments with high-grade narrowing. Symmetric carotid arteries. Proximal MCA signal is symmetrically weak due to loss of signal and direction at the end of tagging slab. No flow limiting stenosis or major branch occlusion identified. Negative for aneurysm. IMPRESSION: 1. No acute finding, including infarct. 2. 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 3. 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 4. Chronic maxillary sinusitis with sinus atelectasis. Electronically Signed   By: Marnee SpringJonathon  Watts M.D.   On: 12/12/2015 11:07    Assessment: 70 y.o. male presenting with episodes of left sided numbness and weakness associated with dizziness.  Concern is for TIA.  Patient on Xarelto (started a few months ago) and ASA (started about a week ago).  Episodes do appear to be slowing in frequency.  MRI of the brain reviewed and shows no acute changes.  MRA  shows high grade right vertebral and bilateral PCA stenosis.  Carotid dopplers show no evidence of hemodynamically significant stenosis.  Echocardiogram pending.  A1c pending, LDL 92.  Stroke Risk Factors - atrial fibrillation and hypertension  Plan: 1. PT consult, OT consult, Speech consult 2. Prophylactic therapy-Continue ASA and Xarelto 3. Telemetry monitoring 4. Frequent neuro checks 5. Statin for lipid management with target LDL<70. 6.  If echocardiogram unremarkable, no further neurological work up recommended at this time.  EEG may be performed  as an outpatient.     Thana FarrLeslie Jamyron Redd, MD Neurology [phone] 12/12/2015, 11:23 AM

## 2015-12-12 NOTE — Progress Notes (Signed)
Physical Therapy Evaluation Patient Details Name: Marybelle KillingsJohn G Mier MRN: [id number] DOB: 31-Aug-1945 Today's Date: 12/12/2015   History of Present Illness  70 y.o. male pt. with a known history of  Atrial fibrillation on anticoagulation who presents with above complaint. Over the past 3 weeks patient has had several episodes of left hand numbness with feelings of lightheadedness. He has also had some spells of left lower extremity numbness and weakness.   Clinical Impression  Patient is [age] year old male who presents with independent mobility for bed mobility and transfers sit to stand. He is able to ambulate without AD 600 feet with no gait deviations. He is able to single leg stand and tandem stand and has no balance deficits or sensory deficits. He has 5/5 strength BLE and has no skilled PT needs.     Follow Up Recommendations No PT follow up    Equipment Recommendations       Recommendations for Other Services       Precautions / Restrictions Precautions Precautions: None Restrictions Weight Bearing Restrictions: No      Mobility  Bed Mobility Overal bed mobility: Independent             General bed mobility comments: Independent  Transfers Overall transfer level: Independent Equipment used: None             General transfer comment:  (Independent without AD)  Ambulation/Gait Ambulation/Gait assistance: Independent Ambulation Distance (Feet): 600 Feet Assistive device: None Gait Pattern/deviations: Step-through pattern     General Gait Details: no deviations and steady without AD  Stairs            Wheelchair Mobility    Modified Rankin (Stroke Patients Only)       Balance Overall balance assessment: Independent;No apparent balance deficits (not formally assessed)                                           Pertinent Vitals/Pain Pain Assessment: No/denies pain    Home Living Family/patient expects to be discharged to::  Private residence   Available Help at Discharge: Family Type of Home: House         Home Equipment: None      Prior Function Level of Independence: Independent               Hand Dominance        Extremity/Trunk Assessment   Upper Extremity Assessment: Overall WFL for tasks assessed           Lower Extremity Assessment: Overall WFL for tasks assessed      Cervical / Trunk Assessment: Normal  Communication   Communication: No difficulties  Cognition Arousal/Alertness: Awake/alert Behavior During Therapy: WFL for tasks assessed/performed Overall Cognitive Status: Within Functional Limits for tasks assessed                      General Comments      Exercises     Assessment/Plan    PT Assessment Patent does not need any further PT services  PT Problem List            PT Treatment Interventions      PT Goals (Current goals can be found in the Care Plan section)  Acute Rehab PT Goals Patient Stated Goal: to go home PT Goal Formulation: With patient Time For Goal Achievement: 12/12/15 Potential to  Achieve Goals: Good    Frequency     Barriers to discharge        Co-evaluation               End of Session Equipment Utilized During Treatment: Gait belt Activity Tolerance: Patient tolerated treatment well Patient left: in bed;with bed alarm set Nurse Communication: Mobility status         Time: 1130-1145 PT Time Calculation (min) (ACUTE ONLY): 15 min   Charges:   PT Evaluation $PT Eval Low Complexity: 1 Procedure     PT G Codes:       Ezekiel InaKristine S Gid Schoffstall, PT, DPT [place], Barkley BrunsKristine S 12/12/2015, 12:40 PM

## 2015-12-12 NOTE — Discharge Summary (Signed)
[facility] - [place] at [facility]   PATIENT NAME: Thomas DaftJohn Newman    MR#:  [id number]  DATE OF BIRTH:  1945-06-21  DATE OF ADMISSION:  12/11/2015 ADMITTING PHYSICIAN: Adrian SaranSital Mody, MD  DATE OF DISCHARGE: 12/12/2015  PRIMARY CARE PHYSICIAN: BLISS, Doreene NestLAURA K, MD    ADMISSION DIAGNOSIS:  Numbness [R20.0] Transient cerebral ischemia, unspecified type [G45.9]  DISCHARGE DIAGNOSIS:  Active Problems:   Numbness   SECONDARY DIAGNOSIS:   Past Medical History:  Diagnosis Date  . Atrial fibrillation (HCC)   . Hypertension     HOSPITAL COURSE:   1. Transient ischemic attack, numbness left arm. 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 Patient already on aspirin and Xarelto. Add Lipitor. Benefits and risks of Lipitor explained. Carotid ultrasound less than 50% blockage. Echocardiogram still pending. I spoke with Dr. Gwen PoundsKowalski and he said he will not be able to read it until either tonight or tomorrow morning. Since the patient looks clinically stable and already on Xarelto we will send home and follow-up as outpatient. 2. Essential hypertension. Because of the thought of stroke the patient's clonidine was held and the patient had rebound hypertension. Restarted clonidine and blood pressure trending better. 3. History of atrial fibrillation on Xarelto   DISCHARGE CONDITIONS:   Satisfactory  CONSULTS OBTAINED:  Treatment Team:  Kym GroomNeuro1 [facility], MD  DRUG ALLERGIES:  No Known Allergies  DISCHARGE MEDICATIONS:   Current Discharge Medication List    START taking these medications   Details  atorvastatin (LIPITOR) 40 MG tablet Take 1 tablet (40 mg total) by mouth daily. Qty: 30 tablet, Refills: 0      CONTINUE these medications which have NOT CHANGED   Details  Multiple Vitamins-Minerals (SENIOR VITES) TBCR Take 1 tablet by mouth daily.    Omega-3 Fatty Acids (FISH OIL  PO) Take 1,400 tablets by mouth daily.    rivaroxaban (XARELTO) 20 MG TABS tablet Take 20 tablets by mouth daily.    aspirin EC 81 MG tablet Take 1 tablet by mouth daily.    cloNIDine (CATAPRES) 0.1 MG tablet Take 0.1 mg by mouth 2 (two) times daily.         DISCHARGE INSTRUCTIONS:   Follow-up PMD one week  If you experience worsening of your admission symptoms, develop shortness of breath, life threatening emergency, suicidal or homicidal thoughts you must seek medical attention immediately by calling 911 or calling your MD immediately  if symptoms less severe.  You Must read complete instructions/literature along with all the possible adverse reactions/side effects for all the Medicines you take and that have been prescribed to you. Take any new Medicines after you have completely understood and accept all the possible adverse reactions/side effects.   Please note  You were cared for by a hospitalist during your hospital stay. If you have any questions about your discharge medications or the care you received while you were in the hospital after you are discharged, you can call the unit and asked to speak with the hospitalist on call if the hospitalist that took care of you is not available. Once you are discharged, your primary care physician will handle any further medical issues. Please note that NO REFILLS for any discharge medications will be authorized once you are discharged, as it is imperative that you return to your primary care physician (or establish a relationship with a primary care physician if you do not  have one) for your aftercare needs so that they can reassess your need for medications and monitor your lab values.    Today   CHIEF COMPLAINT:   Chief Complaint  Patient presents with  . Numbness  . Dizziness    HISTORY OF PRESENT ILLNESS:  Thomas Newman  is a 70 y.o. male presented with numbness and dizziness   VITAL SIGNS:  Blood pressure (!) 181/89, pulse  (!) 52, temperature 99.2 F (37.3 C), temperature source Oral, resp. rate 18, height 5\' 7"  (1.702 m), weight 89.4 kg (197 lb), SpO2 97 %.    PHYSICAL EXAMINATION:  GENERAL:  70 y.o.-year-old patient lying in the bed with no acute distress.  EYES: Pupils equal, round, reactive to light and accommodation. No scleral icterus. Extraocular muscles intact.  HEENT: Head atraumatic, normocephalic. Oropharynx and nasopharynx clear.  NECK:  Supple, no jugular venous distention. No thyroid enlargement, no tenderness.  LUNGS: Normal breath sounds bilaterally, no wheezing, rales,rhonchi or crepitation. No use of accessory muscles of respiration.  CARDIOVASCULAR: S1, S2 normal. No murmurs, rubs, or gallops.  ABDOMEN: Soft, non-tender, non-distended. Bowel sounds present. No organomegaly or mass.  EXTREMITIES: No pedal edema, cyanosis, or clubbing. Good range of motion in neck. NEUROLOGIC: Cranial nerves II through XII are intact. Muscle strength 5/5 in all extremities. Sensation intact. Gait not checked. Finger-nose intact. Romberg slightly off balance to the left. Finger-nose intact PSYCHIATRIC: The patient is alert and oriented x 3.  SKIN: No obvious rash, lesion, or ulcer.   DATA REVIEW:   CBC  Recent Labs Lab 12/11/15 1224  WBC 6.4  HGB 14.3  HCT 43.1  PLT 222    Chemistries   Recent Labs Lab 12/11/15 1224  NA 138  K 4.2  CL 106  CO2 27  GLUCOSE 91  BUN 15  CREATININE 1.30*  CALCIUM 8.8*    Cardiac Enzymes  Recent Labs Lab 12/11/15 1224  TROPONINI <0.03      RADIOLOGY:  Dg Chest 1 View  Result Date: 12/12/2015 CLINICAL DATA:  Left-sided numbness. EXAM: CHEST 1 VIEW COMPARISON:  None. FINDINGS: Mild cardiomegaly. Lungs are clear. No effusions or acute bony abnormality. IMPRESSION: Cardiomegaly.  No active disease. Electronically Signed   By: Charlett Nose M.D.   On: 12/12/2015 08:59   Dg Cervical Spine Complete  Result Date: 12/11/2015 CLINICAL DATA:  Left arm  numbness.  No known injury. EXAM: CERVICAL SPINE - COMPLETE 4+ VIEW COMPARISON:  None. FINDINGS: Disc space narrowing and moderately large and large anterior spurs at the C3-4 through C6-7 levels. Milder posterior spur formation at those levels. There are also uncinate spurs bilaterally at those levels. Minimal foraminal stenosis on the right at the C3-4 and C4-5 levels and mild to moderate foraminal stenosis on the right at the C5-6 and C6-7 levels. Mild foraminal stenosis on the left at the C3-4 level and moderate foraminal stenosis on the left at the C5-6 and C6-7 levels. No significant foraminal stenosis on the left at the C4-5 level. IMPRESSION: Multilevel cervical spine degenerative changes, as described above. There is moderate foraminal stenosis on the left at the C5-6 and C6-7 levels. Electronically Signed   By: Beckie Salts M.D.   On: 12/11/2015 14:42   Ct Head Wo Contrast  Result Date: 12/11/2015 CLINICAL DATA:  Pt states numbness in left arm Pt dizzy And light headedPt states started in october EXAM: CT HEAD WITHOUT CONTRAST TECHNIQUE: Contiguous axial images were obtained from the base of the skull through the  vertex without intravenous contrast. COMPARISON:  None. FINDINGS: Brain: No evidence of acute infarction, hemorrhage, hydrocephalus, extra-axial collection or mass lesion/mass effect. Vascular: No hyperdense vessel or unexpected calcification. Skull: Normal. Negative for fracture or focal lesion. Sinuses/Orbits: Visualized upper maxillary sinuses are opacified. Ethmoid, frontal and sphenoid sinuses are clear. Clear mastoid air cells. Globes and orbits are unremarkable. Other: Small calcified left parietal scalp lesion, incidental. IMPRESSION: 1. No acute intracranial abnormalities. 2. Opacified maxillary sinuses, incompletely imaged. Electronically Signed   By: Amie Portland M.D.   On: 12/11/2015 13:02   Mr Brain Wo Contrast  Result Date: 12/12/2015 CLINICAL DATA:  Left-sided numbness.  History of atrial fibrillation. EXAM: MRI HEAD WITHOUT CONTRAST MRA HEAD WITHOUT CONTRAST TECHNIQUE: Multiplanar, multiecho pulse sequences of the brain and surrounding structures were obtained without intravenous contrast. Angiographic images of the head were obtained using MRA technique without contrast. COMPARISON:  None. FINDINGS: MRI HEAD FINDINGS Brain: No acute infarction, hemorrhage, hydrocephalus, extra-axial collection or mass lesion. 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 No notable ischemic change in the cerebral white matter. Vascular: Preserved flow voids.  Arterial findings below. Skull and upper cervical spine: Upper cervical degenerative disc disease. No focal marrow lesion. Sinuses/Orbits: Small bilateral maxillary sinuses with diffuse opacification. Other: Dermal inclusion cyst in left parietal scalp. MRA HEAD FINDINGS Standard vertebrobasilar branching. The left PICA origin is not covered. High-grade narrowing of the distal right vertebral artery at the vertebrobasilar junction. Basilar is smooth and widely patent. Severe stenosis with flow gap and weak downstream enhancement at the right P1 2 junction. There is atheromatous irregularity of the left P2 and P3 segments with high-grade narrowing. Symmetric carotid arteries. Proximal MCA signal is symmetrically weak due to loss of signal and direction at the end of tagging slab. No flow limiting stenosis or major branch occlusion identified. Negative for aneurysm. IMPRESSION: 1. No acute finding, including infarct. 2. 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 3. 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 4. Chronic maxillary sinusitis with sinus atelectasis. Electronically Signed   By: Marnee Spring M.D.   On: 12/12/2015 11:07   US Carotid Bilateral (at [facility] And [facility] Only)  Result Date: 12/12/2015 CLINICAL DATA:  Tingling in left arm  and leg for 3 weeks with dizziness EXAM: BILATERAL CAROTID DUPLEX ULTRASOUND TECHNIQUE: Wallace Cullens scale imaging, color Doppler and duplex ultrasound were performed of bilateral carotid and vertebral arteries in the neck. COMPARISON:  None. FINDINGS: Criteria: Quantification of carotid stenosis is based on velocity parameters that correlate the residual internal carotid diameter with NASCET-based stenosis levels, using the diameter of the distal internal carotid lumen as the denominator for stenosis measurement. The following velocity measurements were obtained: RIGHT ICA:  69 cm/sec CCA:  85 cm/sec SYSTOLIC ICA/CCA RATIO:  0.8 DIASTOLIC ICA/CCA RATIO:  0.9 ECA:  95 cm/sec LEFT ICA:  75 cm/sec CCA:  96 cm/sec SYSTOLIC ICA/CCA RATIO:  0.8 DIASTOLIC ICA/CCA RATIO:  1.4 ECA:  105 cm/sec RIGHT CAROTID ARTERY: Little if any plaque in the bulb. Low resistance internal carotid Doppler pattern. Heart rate is irregular. RIGHT VERTEBRAL ARTERY:  Antegrade. LEFT CAROTID ARTERY: Mild irregular calcified plaque is present in the bulb. Low resistance internal carotid Doppler pattern is preserved. LEFT VERTEBRAL ARTERY:  Antegrade. IMPRESSION: Less than 50% stenosis in the right and left internal carotid arteries. There is mild irregular calcified plaque in the left bulb. Irregular cardiac rhythm.  Correlation with EKG is recommended.  Electronically Signed   By: Jolaine ClickArthur  Hoss M.D.   On: 12/12/2015 09:53   Mr Maxine GlennMra Head/brain NFWo Cm  Result Date: 12/12/2015 CLINICAL DATA:  Left-sided numbness. History of atrial fibrillation. EXAM: MRI HEAD WITHOUT CONTRAST MRA HEAD WITHOUT CONTRAST TECHNIQUE: Multiplanar, multiecho pulse sequences of the brain and surrounding structures were obtained without intravenous contrast. Angiographic images of the head were obtained using MRA technique without contrast. COMPARISON:  None. FINDINGS: MRI HEAD FINDINGS Brain: No acute infarction, hemorrhage, hydrocephalus, extra-axial collection or mass lesion. 2  or 3 small remote right cerebellar infarcts. No notable ischemic change in the cerebral white matter. Vascular: Preserved flow voids.  Arterial findings below. Skull and upper cervical spine: Upper cervical degenerative disc disease. No focal marrow lesion. Sinuses/Orbits: Small bilateral maxillary sinuses with diffuse opacification. Other: Dermal inclusion cyst in left parietal scalp. MRA HEAD FINDINGS Standard vertebrobasilar branching. The left PICA origin is not covered. High-grade narrowing of the distal right vertebral artery at the vertebrobasilar junction. Basilar is smooth and widely patent. Severe stenosis with flow gap and weak downstream enhancement at the right P1 2 junction. There is atheromatous irregularity of the left P2 and P3 segments with high-grade narrowing. Symmetric carotid arteries. Proximal MCA signal is symmetrically weak due to loss of signal and direction at the end of tagging slab. No flow limiting stenosis or major branch occlusion identified. Negative for aneurysm. IMPRESSION: 1. No acute finding, including infarct. 2. 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 3. 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 4. Chronic maxillary sinusitis with sinus atelectasis. Electronically Signed   By: Marnee SpringJonathon  Watts M.D.   On: 12/12/2015 11:07      Management plans discussed with the patient, family and they are in agreement.  CODE STATUS:     Code Status Orders        Start     Ordered   12/11/15 1606  Full code  Continuous     12/11/15 1605    Code Status History    Date Active Date Inactive Code Status Order ID Comments User Context   12/11/2015  2:54 PM 12/11/2015  4:05 PM DNR [id number]  Adrian SaranSital Mody, MD ED    Advance Directive Documentation   Flowsheet Row Most Recent Value  Type of Advance Directive  Healthcare Power of Attorney, Living will  Pre-existing out of  facility DNR order (yellow form or pink MOST form)  No data  "MOST" Form in Place?  No data      TOTAL TIME TAKING CARE OF THIS PATIENT: 35 minutes.    Alford HighlandWIETING, Darrielle Pflieger M.D on 12/12/2015 at 2:15 PM  Between 7am to 6pm - Pager - [phone]  After 6pm go to [email or web address] - password [email or web address]  [facility] Office  [phone]  CC: Primary care physician; BLISS, Doreene NestLAURA K, MD

## 2015-12-12 NOTE — Discharge Instructions (Addendum)
Transient Ischemic Attack °A transient ischemic attack (TIA) is a "warning stroke" that causes stroke-like symptoms. Unlike a stroke, a TIA does not cause permanent damage to the brain. The symptoms of a TIA can happen very fast and do not last long. It is important to know the symptoms of a TIA and what to do. This can help prevent a major stroke or death. °What are the causes? °A TIA is caused by a temporary blockage in an artery in the brain or neck (carotid artery). The blockage does not allow the brain to get the blood supply it needs and can cause different symptoms. The blockage can be caused by either: °· A blood clot. °· Fatty buildup (plaque) in a neck or brain artery. ° °What increases the risk? °· High blood pressure (hypertension). °· High cholesterol. °· Diabetes mellitus. °· Heart disease. °· The buildup of plaque in the blood vessels (peripheral artery disease or atherosclerosis). °· The buildup of plaque in the blood vessels that provide blood and oxygen to the brain (carotid artery stenosis). °· An abnormal heart rhythm (atrial fibrillation). °· Obesity. °· Using any tobacco products, including cigarettes, chewing tobacco, or electronic cigarettes. °· Taking oral contraceptives, especially in combination with using tobacco. °· Physical inactivity. °· A diet high in fats, salt (sodium), and calories. °· Excessive alcohol use. °· Use of illegal drugs (especially cocaine and methamphetamine). °· Being male. °· Being African American. °· Being over the age of 55 years. °· Family history of stroke. °· Previous history of blood clots, stroke, TIA, or heart attack. °· Sickle cell disease. °What are the signs or symptoms? °TIA symptoms are the same as a stroke but are temporary. These symptoms usually develop suddenly, or may be newly present upon waking from sleep: °· Sudden weakness or numbness of the face, arm, or leg, especially on one side of the body. °· Sudden trouble walking or difficulty moving  arms or legs. °· Sudden confusion. °· Sudden personality changes. °· Trouble speaking (aphasia) or understanding. °· Difficulty swallowing. °· Sudden trouble seeing in one or both eyes. °· Double vision. °· Dizziness. °· Loss of balance or coordination. °· Sudden severe headache with no known cause. °· Trouble reading or writing. °· Loss of bowel or bladder control. °· Loss of consciousness. ° °How is this diagnosed? °Your health care provider may be able to determine the presence or absence of a TIA based on your symptoms, history, and physical exam. CT scan of the brain is usually performed to help identify a TIA. Other tests may include: °· Electrocardiography (ECG). °· Continuous heart monitoring. °· Echocardiography. °· Carotid ultrasonography. °· MRI. °· A scan of the brain circulation. °· Blood tests. ° °How is this treated? °Since the symptoms of TIA are the same as a stroke, it is important to seek treatment as soon as possible. You may need a medicine to dissolve a blood clot (thrombolytic) if that is the cause of the TIA. This medicine cannot be given if too much time has passed. Treatment may also include: °· Rest, oxygen, fluids through an IV tube, and medicines to thin the blood (anticoagulants). °· Measures will be taken to prevent short-term and long-term complications, including infection from breathing foreign material into the lungs (aspiration pneumonia), blood clots in the legs, and falls. °· Procedures to either remove plaque in the carotid arteries or dilate carotid arteries that have narrowed due to plaque. Those procedures are: °? Carotid endarterectomy. °? Carotid angioplasty and stenting. °· Medicines   and diet may be used to address diabetes, high blood pressure, and other underlying risk factors. ° °Follow these instructions at home: °· Take medicines only as directed by your health care provider. Follow the directions carefully. Medicines may be used to control risk factors for a stroke.  Be sure you understand all your medicine instructions. °· You may be told to take aspirin or the anticoagulant warfarin. Warfarin needs to be taken exactly as instructed. °? Taking too much or too little warfarin is dangerous. Too much warfarin increases the risk of bleeding. Too little warfarin continues to allow the risk for blood clots. While taking warfarin, you will need to have regular blood tests to measure your blood clotting time. A PT blood test measures how long it takes for blood to clot. Your PT is used to calculate another value called an INR. Your PT and INR help your health care provider to adjust your dose of warfarin. The dose can change for many reasons. It is critically important that you take warfarin exactly as prescribed. °? Many foods, especially foods high in vitamin K can interfere with warfarin and affect the PT and INR. Foods high in vitamin K include spinach, kale, broccoli, cabbage, collard and turnip greens, Brussels sprouts, peas, cauliflower, seaweed, and parsley, as well as beef and pork liver, green tea, and soybean oil. You should eat a consistent amount of foods high in vitamin K. Avoid major changes in your diet, or notify your health care provider before changing your diet. Arrange a visit with a dietitian to answer your questions. °? Many medicines can interfere with warfarin and affect the PT and INR. You must tell your health care provider about any and all medicines you take; this includes all vitamins and supplements. Be especially cautious with aspirin and anti-inflammatory medicines. Do not take or discontinue any prescribed or over-the-counter medicine except on the advice of your health care provider or pharmacist. °? Warfarin can have side effects, such as excessive bruising or bleeding. You will need to hold pressure over cuts for longer than usual. Your health care provider or pharmacist will discuss other potential side effects. °? Avoid sports or activities that  may cause injury or bleeding. °? Be careful when shaving, flossing your teeth, or handling sharp objects. °? Alcohol can change the body's ability to handle warfarin. It is best to avoid alcoholic drinks or consume only very small amounts while taking warfarin. Notify your health care provider if you change your alcohol intake. °? Notify your dentist or other health care providers before procedures. °· Eat a diet that includes 5 or more servings of fruits and vegetables each day. This may reduce the risk of stroke. Certain diets may be prescribed to address high blood pressure, high cholesterol, diabetes, or obesity. °? A diet low in sodium, saturated fat, trans fat, and cholesterol is recommended to manage high blood pressure. °? A diet low in saturated fat, trans fat, and cholesterol, and high in fiber may control cholesterol levels. °? A controlled-carbohydrate, controlled-sugar diet is recommended to manage diabetes. °? A reduced-calorie diet that is low in sodium, saturated fat, trans fat, and cholesterol is recommended to manage obesity. °· Maintain a healthy weight. °· Stay physically active. It is recommended that you get at least 30 minutes of activity on most or all days. °· Do not use any tobacco products, including cigarettes, chewing tobacco, or electronic cigarettes. If you need help quitting, ask your health care provider. °· Limit alcohol intake   to no more than 1 drink per day for nonpregnant women and 2 drinks per day for men. One drink equals 12 ounces of beer, 5 ounces of wine, or 1½ ounces of hard liquor. °· Do not abuse drugs. °· A safe home environment is important to reduce the risk of falls. Your health care provider may arrange for specialists to evaluate your home. Having grab bars in the bedroom and bathroom is often important. Your health care provider may arrange for equipment to be used at home, such as raised toilets and a seat for the shower. °· Follow all instructions for follow-up  with your health care provider. This is very important. This includes any referrals and lab tests. Proper follow-up can prevent a stroke or another TIA from occurring. °How is this prevented? °The risk of a TIA can be decreased by appropriately treating high blood pressure, high cholesterol, diabetes, heart disease, and obesity, and by quitting smoking, limiting alcohol, and staying physically active. °Contact a health care provider if: °· You have personality changes. °· You have difficulty swallowing. °· You are seeing double. °· You have dizziness. °· You have a fever. °Get help right away if: °Any of the following symptoms may represent a serious problem that is an emergency. Do not wait to see if the symptoms will go away. Get medical help right away. Call your local emergency services (911 in [place] Do not drive yourself to the hospital. °· You have sudden weakness or numbness of the face, arm, or leg, especially on one side of the body. °· You have sudden trouble walking or difficulty moving arms or legs. °· You have sudden confusion. °· You have trouble speaking (aphasia) or understanding. °· You have sudden trouble seeing in one or both eyes. °· You have a loss of balance or coordination. °· You have a sudden, severe headache with no known cause. °· You have new chest pain or an irregular heartbeat. °· You have a partial or total loss of consciousness. ° °This information is not intended to replace advice given to you by your health care provider. Make sure you discuss any questions you have with your health care provider. °Document Released: 10/20/2004 Document Revised: 09/14/2015 Document Reviewed: 04/17/2013 °Elsevier Interactive Patient Education © 2017 [employer] ° °

## 2015-12-12 NOTE — Progress Notes (Signed)
OT Cancellation Note  Patient Details Name: Marybelle KillingsJohn G Cabeza MRN: [id number] DOB: [birth year]   Cancelled Treatment:    Reason Eval/Treat Not Completed: Patient at procedure or test/ unavailable. Chart reviewed, spoke to patient's wife about the home set up and AE/DME at home. Will attempt to evaluation at later date/time when patient is available.  Eliezer BottomJamie L Stiller, OTR/L 12/12/2015, 10:45 AM

## 2015-12-12 NOTE — Progress Notes (Signed)
Discharge instructions along with home medications and follow up gone over with patient and wife. Both verbalize that they understood instructions. One prescription given to patient. IV and tele removed. Pt being discharged home on room air, no distress noted. Otilio JeffersonMadelyn S Fenton, RN

## 2015-12-13 LAB — ECHOCARDIOGRAM COMPLETE
Height: 67 in
Weight: 3152 oz

## 2015-12-13 LAB — HEMOGLOBIN A1C
Hgb A1c MFr Bld: 6 % — ABNORMAL HIGH (ref 4.8–5.6)
Mean Plasma Glucose: 126 mg/dL

## 2015-12-15 NOTE — Progress Notes (Signed)
   12/12/15 1301  Acute Rehab PT Goals  Patient Stated Goal to go home  PT G-Codes **NOT FOR INPATIENT CLASS**  Functional Assessment Tool Used clinical judgement  Functional Limitation Mobility: Walking and moving around  Mobility: Walking and Moving Around Current Status (Z6109(G8978) CH  Mobility: Walking and Moving Around Goal Status (U0454(G8979) CH  Mobility: Walking and Moving Around Discharge Status [phone](G8980) CH  Late entry g codes added after review of initial documentation.  Ezekiel InaKristine S Tobby Fawcett, PT, DPT

## 2016-04-15 ENCOUNTER — Encounter: Payer: Self-pay | Admitting: Internal Medicine

## 2016-04-15 ENCOUNTER — Ambulatory Visit (INDEPENDENT_AMBULATORY_CARE_PROVIDER_SITE_OTHER): Payer: Medicare HMO | Admitting: Internal Medicine

## 2016-04-15 VITALS — BP 132/74 | HR 86 | Ht 67.0 in | Wt 187.0 lb

## 2016-04-15 DIAGNOSIS — J45909 Unspecified asthma, uncomplicated: Secondary | ICD-10-CM

## 2016-04-15 DIAGNOSIS — R0602 Shortness of breath: Secondary | ICD-10-CM

## 2016-04-15 DIAGNOSIS — G4719 Other hypersomnia: Secondary | ICD-10-CM | POA: Diagnosis not present

## 2016-04-15 MED ORDER — GLYCOPYRROLATE-FORMOTEROL 9-4.8 MCG/ACT IN AERO: 2.0000 | INHALATION_SPRAY | Freq: Two times a day (BID) | RESPIRATORY_TRACT | 0 refills | Status: AC

## 2016-04-15 MED ORDER — GLYCOPYRROLATE-FORMOTEROL 9-4.8 MCG/ACT IN AERO: 2.0000 | INHALATION_SPRAY | Freq: Two times a day (BID) | RESPIRATORY_TRACT | 5 refills | Status: DC

## 2016-04-15 NOTE — Patient Instructions (Signed)
Check CXR 2 View Check 6WMT Check ONO Test for OSA with sleep study Start Bevespi and assess breathing  symptoms

## 2016-04-15 NOTE — Progress Notes (Signed)
[facility]  Pulmonary Medicine Consultation      Date: 04/15/2016,   MRN# [id number] Thomas KillingsJohn G Newman 02-13-1945 Code Status:  Code Status History    Date Active Date Inactive Code Status Order ID Comments User Context   12/11/2015  4:06 PM 12/12/2015  6:07 PM Full Code [id number]  Thomas SaranSital Mody, MD Inpatient   12/11/2015  2:54 PM 12/11/2015  4:05 PM DNR [id number]  Thomas SaranSital Mody, MD ED     Hosp day:@LENGTHOFSTAYDAYS @ Referring MD: @ATDPROV @     PCP:      AdmissionWeight: 187 lb (84.8 kg)                 CurrentWeight: 187 lb (84.8 kg) Thomas KillingsJohn G Newman is a 71 y.o. old male seen in consultation for SOB at the request of Dr. Quillian Newman     CHIEF COMPLAINT:   SOB   HISTORY OF PRESENT ILLNESS   71 yo pleasant white male seen today for SOB SOB associated with exertion Been going on for 1 year SOB worsens when going up and down stairs Occasional cough, intermittent wheezing Occasional chest pressure with exertion-[facility] Has afib and is under control SOB with exertion worsens with long distances not short distances +TIA in the past Patients primary office noted that p atient had 50% lung function Patient was given albuterol inhaler and states that it does not help Current Office spiro shows NL spriometry No restrictive or obstructive lung disease Ratio 76& FVC 2.4L 85% predicted Fev1 3.2L 82% predicted  Patient has no acute issues at this time No signs of infection at this time  Patient has been having excessive daytime sleepiness Patient has been having extreme fatigue and tiredness, lack of energy Patient has been told that she has very  Loud snoring every night Denies SOB at night  worsenes when going up and down stairs  PAST MEDICAL HISTORY   Past Medical History:  Diagnosis Date  . Atrial fibrillation (HCC)   . Hypertension      SURGICAL HISTORY   Past Surgical History:  Procedure Laterality Date  . CARPAL TUNNEL RELEASE    . HERNIA  REPAIR       FAMILY HISTORY   Family History  Problem Relation Age of Onset  . Atrial fibrillation Mother   . Heart attack Father   . Breast cancer Sister   . Breast cancer Sister      SOCIAL HISTORY   Social History  Substance Use Topics  . Smoking status: Never Smoker  . Smokeless tobacco: Never Used  . Alcohol use Yes     Comment: occ     MEDICATIONS    Home Medication:  Current Outpatient Rx  . Order #: [id number] Class: Historical Med  . Order #: [id number] Class: Historical Med  . Order #: [id number] Class: Historical Med  . Order #: [id number] Class: Historical Med  . Order #: [id number] Class: Historical Med    Current Medication:  Current Outpatient Prescriptions:  .  aspirin EC 81 MG tablet, Take 1 tablet by mouth daily., Disp: , Rfl:  .  cloNIDine (CATAPRES) 0.1 MG tablet, Take 0.1 mg by mouth 2 (two) times daily., Disp: , Rfl:  .  Multiple Vitamins-Minerals (SENIOR VITES) TBCR, Take 1 tablet by mouth daily., Disp: , Rfl:  .  Omega-3 Fatty Acids (FISH OIL PO), Take 1,400 tablets by mouth daily., Disp: , Rfl:  .  rivaroxaban (XARELTO) 20 MG TABS tablet, Take 20 tablets by mouth daily., Disp: , Rfl:  ALLERGIES   Patient has no known allergies.     REVIEW OF SYSTEMS   Review of Systems  Constitutional: Positive for malaise/fatigue. Negative for chills, diaphoresis, fever and weight loss.  HENT: Negative for congestion and hearing loss.   Eyes: Negative for blurred vision and double vision.  Respiratory: Positive for shortness of breath. Negative for cough, hemoptysis, sputum production and wheezing.   Cardiovascular: Negative for chest pain, palpitations and orthopnea.  Gastrointestinal: Negative for abdominal pain, heartburn, nausea and vomiting.  Genitourinary: Negative for dysuria and urgency.  Musculoskeletal: Negative for back pain, myalgias and neck pain.  Skin: Negative for rash.  Neurological: Negative for dizziness, tingling, tremors,  weakness and headaches.  Endo/Heme/Allergies: Does not bruise/bleed easily.  Psychiatric/Behavioral: The patient is not nervous/anxious.   All other systems reviewed and are negative.    VS: BP 132/74 (BP Location: Left Arm, Cuff Size: Normal)   Pulse 86   Ht 5\' 7"  (1.702 m)   Wt 187 lb (84.8 kg)   SpO2 96%   BMI 29.29 kg/m      PHYSICAL EXAM  Physical Exam  Constitutional: He is oriented to person, place, and time. He appears well-developed and well-nourished. No distress.  HENT:  Head: Normocephalic and atraumatic.  Mouth/Throat: No oropharyngeal exudate.  Eyes: EOM are normal. Pupils are equal, round, and reactive to light. No scleral icterus.  Neck: Normal range of motion. Neck supple.  Cardiovascular: Normal rate, regular rhythm and normal heart sounds.   No murmur heard. Pulmonary/Chest: No stridor. No respiratory distress. He has no wheezes.  Abdominal: Soft. Bowel sounds are normal.  Musculoskeletal: Normal range of motion. He exhibits no edema.  Neurological: He is alert and oriented to person, place, and time. No cranial nerve deficit.  Skin: Skin is warm. He is not diaphoretic.  Psychiatric: He has a normal mood and affect.          IMAGING   CXR 11/2015 I have Independently reviewed images of  CXR   on 04/15/2016 Interpretation:no acute issues, no pneumonia, no effusions     ASSESSMENT/PLAN   71 yo white male with signs and symptoms of reactive airways disease with extereme exertion with signs and symptoms of excessive daytime fatigue and somnolence, with h/o afib and TIA  1.check CXR PA/Lat 2.check  3.check ONO 4.start AC/LABA with Bevespi 5.will need to obtain sleep study ASAP   Follow up after tests completed  Patient satisfied with Plan of action and management. All questions answered  Lucie Leather, M.D.  [facility]  Medical Director [facility] Medical Director [facility] Cardio-Pulmonary  Department

## 2016-04-20 ENCOUNTER — Telehealth: Payer: Self-pay | Admitting: Internal Medicine

## 2016-04-20 NOTE — Telephone Encounter (Signed)
Per Verlon AuLeslie at [facility] pt refused to arrange the ONO and [facility] has cancelled this order. Pt also informed Verlon AuLeslie with [facility] that patient "was going to cancel Sleep Study b/c he didn't feel like this was his problem".    Just FYI for you. Rhonda J Cobb

## 2016-04-21 ENCOUNTER — Ambulatory Visit
Admission: RE | Admit: 2016-04-21 | Discharge: 2016-04-21 | Disposition: A | Discharge status: Home / Self Care | Payer: Medicare HMO | Source: Ambulatory Visit | Attending: Internal Medicine | Admitting: Internal Medicine

## 2016-04-21 ENCOUNTER — Telehealth: Payer: Self-pay | Admitting: Internal Medicine

## 2016-04-21 DIAGNOSIS — R0602 Shortness of breath: Secondary | ICD-10-CM | POA: Insufficient documentation

## 2016-04-21 NOTE — Telephone Encounter (Signed)
Pt states he will still do the CXR and will get his PCP. Has SOB at times but he states not enough to do the test at this time. States he will not do the ONO and sleep study at this time. Will cancel f/u appt and will have PCP to place another referral if needed.    FYI

## 2016-04-21 NOTE — Telephone Encounter (Signed)
Pt would like to cancel his sleep study and his oxygen test. Please call. He states he would like to talk with Dr. Belia HemanKasa before he has thes tests.

## 2016-04-27 ENCOUNTER — Ambulatory Visit: Payer: Medicare HMO

## 2016-04-28 ENCOUNTER — Encounter: Payer: Self-pay | Admitting: *Deleted

## 2016-05-03 ENCOUNTER — Encounter
Admission: RE | Disposition: A | Discharge status: Home / Self Care | Payer: Self-pay | Source: Ambulatory Visit | Attending: Ophthalmology

## 2016-05-03 ENCOUNTER — Ambulatory Visit: Payer: Medicare HMO | Admitting: Anesthesiology

## 2016-05-03 ENCOUNTER — Ambulatory Visit
Admission: RE | Admit: 2016-05-03 | Discharge: 2016-05-03 | Disposition: A | Discharge status: Home / Self Care | Payer: Medicare HMO | Source: Ambulatory Visit | Attending: Ophthalmology | Admitting: Ophthalmology

## 2016-05-03 ENCOUNTER — Encounter: Payer: Self-pay | Admitting: *Deleted

## 2016-05-03 DIAGNOSIS — H2511 Age-related nuclear cataract, right eye: Secondary | ICD-10-CM | POA: Insufficient documentation

## 2016-05-03 DIAGNOSIS — I10 Essential (primary) hypertension: Secondary | ICD-10-CM | POA: Diagnosis not present

## 2016-05-03 DIAGNOSIS — I4891 Unspecified atrial fibrillation: Secondary | ICD-10-CM | POA: Insufficient documentation

## 2016-05-03 DIAGNOSIS — Z8673 Personal history of transient ischemic attack (TIA), and cerebral infarction without residual deficits: Secondary | ICD-10-CM | POA: Diagnosis not present

## 2016-05-03 DIAGNOSIS — M1990 Unspecified osteoarthritis, unspecified site: Secondary | ICD-10-CM | POA: Insufficient documentation

## 2016-05-03 HISTORY — DX: Dyspnea, unspecified: R06.00

## 2016-05-03 HISTORY — DX: Cardiac arrhythmia, unspecified: I49.9

## 2016-05-03 HISTORY — DX: Unspecified osteoarthritis, unspecified site: M19.90

## 2016-05-03 HISTORY — PX: CATARACT EXTRACTION W/PHACO: SHX586

## 2016-05-03 HISTORY — DX: Cerebral infarction, unspecified: I63.9

## 2016-05-03 SURGERY — PHACOEMULSIFICATION, CATARACT, WITH IOL INSERTION
Anesthesia: Monitor Anesthesia Care | Site: Eye | Laterality: Right | Wound class: Clean

## 2016-05-03 MED ORDER — EPINEPHRINE PF 1 MG/ML IJ SOLN
INTRAMUSCULAR | Status: DC | PRN
  Administered 2016-05-03: 1 mL via OPHTHALMIC

## 2016-05-03 MED ORDER — CARBACHOL 0.01 % IO SOLN
INTRAOCULAR | Status: DC | PRN
  Administered 2016-05-03: .5 mL via INTRAOCULAR

## 2016-05-03 MED ORDER — FENTANYL CITRATE (PF) 100 MCG/2ML IJ SOLN
INTRAMUSCULAR | Status: AC
  Filled 2016-05-03: qty 2

## 2016-05-03 MED ORDER — ARMC OPHTHALMIC DILATING DROPS
1.0000 "application " | OPHTHALMIC | Status: AC
  Administered 2016-05-03 (×3): 1 via OPHTHALMIC

## 2016-05-03 MED ORDER — POVIDONE-IODINE 5 % OP SOLN
OPHTHALMIC | Status: DC | PRN
  Administered 2016-05-03: 1 via OPHTHALMIC

## 2016-05-03 MED ORDER — FENTANYL CITRATE (PF) 100 MCG/2ML IJ SOLN
INTRAMUSCULAR | Status: DC | PRN
  Administered 2016-05-03: 25 ug via INTRAVENOUS
  Administered 2016-05-03: 50 ug via INTRAVENOUS
  Administered 2016-05-03: 25 ug via INTRAVENOUS

## 2016-05-03 MED ORDER — MOXIFLOXACIN HCL 0.5 % OP SOLN
OPHTHALMIC | Status: AC
  Filled 2016-05-03: qty 3

## 2016-05-03 MED ORDER — NA CHONDROIT SULF-NA HYALURON 40-17 MG/ML IO SOLN
INTRAOCULAR | Status: DC | PRN
  Administered 2016-05-03: 1 mL via INTRAOCULAR

## 2016-05-03 MED ORDER — MOXIFLOXACIN HCL 0.5 % OP SOLN
OPHTHALMIC | Status: DC | PRN
  Administered 2016-05-03: .2 mL via OPHTHALMIC

## 2016-05-03 MED ORDER — ARMC OPHTHALMIC DILATING DROPS
OPHTHALMIC | Status: AC
  Administered 2016-05-03: 1 via OPHTHALMIC
  Filled 2016-05-03: qty 0.4

## 2016-05-03 MED ORDER — EPINEPHRINE PF 1 MG/ML IJ SOLN
INTRAMUSCULAR | Status: AC
  Filled 2016-05-03: qty 1

## 2016-05-03 MED ORDER — SODIUM CHLORIDE 0.9 % IV SOLN
INTRAVENOUS | Status: DC
  Administered 2016-05-03: 50 mL/h via INTRAVENOUS

## 2016-05-03 MED ORDER — POVIDONE-IODINE 5 % OP SOLN
OPHTHALMIC | Status: AC
  Filled 2016-05-03: qty 30

## 2016-05-03 MED ORDER — MOXIFLOXACIN HCL 0.5 % OP SOLN: 1.0000 [drp] | OPHTHALMIC | Status: DC | PRN

## 2016-05-03 MED ORDER — LIDOCAINE HCL (PF) 4 % IJ SOLN
INTRAMUSCULAR | Status: DC | PRN
  Administered 2016-05-03: 2 mL via OPHTHALMIC

## 2016-05-03 MED ORDER — EPINEPHRINE PF 1 MG/ML IJ SOLN
INTRAMUSCULAR | Status: AC
  Filled 2016-05-03: qty 2

## 2016-05-03 SURGICAL SUPPLY — 21 items
CANNULA ANT/CHMB 27GA (MISCELLANEOUS) ×3 IMPLANT
CUP MEDICINE 2OZ PLAST GRAD ST (MISCELLANEOUS) ×3 IMPLANT
GLOVE BIO SURGEON STRL SZ8 (GLOVE) ×3 IMPLANT
GLOVE BIOGEL M 6.5 STRL (GLOVE) ×3 IMPLANT
GLOVE SURG LX 8.0 MICRO (GLOVE) ×2
GLOVE SURG LX STRL 8.0 MICRO (GLOVE) ×1 IMPLANT
GOWN STRL REUS W/ TWL LRG LVL3 (GOWN DISPOSABLE) ×2 IMPLANT
GOWN STRL REUS W/TWL LRG LVL3 (GOWN DISPOSABLE) ×4
LENS IOL TECNIS ITEC 19.5 (Intraocular Lens) ×3 IMPLANT
PACK CATARACT (MISCELLANEOUS) ×3 IMPLANT
PACK CATARACT BRASINGTON LX (MISCELLANEOUS) ×3 IMPLANT
PACK EYE AFTER SURG (MISCELLANEOUS) ×3 IMPLANT
SOL BSS BAG (MISCELLANEOUS) ×3
SOL PREP PVP 2OZ (MISCELLANEOUS) ×3
SOLUTION BSS BAG (MISCELLANEOUS) ×1 IMPLANT
SOLUTION PREP PVP 2OZ (MISCELLANEOUS) ×1 IMPLANT
SYR 3ML LL SCALE MARK (SYRINGE) ×3 IMPLANT
SYR 5ML LL (SYRINGE) ×3 IMPLANT
SYR TB 1ML 27GX1/2 LL (SYRINGE) ×3 IMPLANT
WATER STERILE IRR 250ML POUR (IV SOLUTION) ×3 IMPLANT
WIPE NON LINTING 3.25X3.25 (MISCELLANEOUS) ×3 IMPLANT

## 2016-05-03 NOTE — H&P (Signed)
All labs reviewed. Abnormal studies sent to patients PCP when indicated.  Previous H&P reviewed, patient examined, there are NO CHANGES.  Thomas Newman LOUIS4/10/201810:42 AM

## 2016-05-03 NOTE — Op Note (Signed)
PREOPERATIVE DIAGNOSIS:  Nuclear sclerotic cataract of the right eye.   POSTOPERATIVE DIAGNOSIS:  nuclear sclerotic cataract right eye   OPERATIVE PROCEDURE: Procedure(s): CATARACT EXTRACTION PHACO AND INTRAOCULAR LENS PLACEMENT (IOC)   SURGEON:  Galen Manila, MD.   ANESTHESIA:  Anesthesiologist: Alver Fisher, MD CRNA: Omer Jack, CRNA  1.      Managed anesthesia care. 2.      0.31ml of Shugarcaine was instilled in the eye following the paracentesis.   COMPLICATIONS:  None.   TECHNIQUE:   Stop and chop   DESCRIPTION OF PROCEDURE:  The patient was examined and consented in the preoperative holding area where the aforementioned topical anesthesia was applied to the right eye and then brought back to the Operating Room where the right eye was prepped and draped in the usual sterile ophthalmic fashion and a lid speculum was placed. A paracentesis was created with the side port blade and the anterior chamber was filled with viscoelastic. A near clear corneal incision was performed with the steel keratome. A continuous curvilinear capsulorrhexis was performed with a cystotome followed by the capsulorrhexis forceps. Hydrodissection and hydrodelineation were carried out with BSS on a blunt cannula. The lens was removed in a stop and chop  technique and the remaining cortical material was removed with the irrigation-aspiration handpiece. The capsular bag was inflated with viscoelastic and the Technis [id number]  lens was placed in the capsular bag without complication. The remaining viscoelastic was removed from the eye with the irrigation-aspiration handpiece. The wounds were hydrated. The anterior chamber was flushed with Miostat and the eye was inflated to physiologic pressure. 0.93ml of Vigamox was placed in the anterior chamber. The wounds were found to be water tight. The eye was dressed with Vigamox. The patient was given protective glasses to wear throughout the day and a shield with which to  sleep tonight. The patient was also given drops with which to begin a drop regimen today and will follow-up with me in one day.  Implant Name Type Inv. Item Serial No. Manufacturer Lot No. LRB No. Used  LENS IOL DIOP 19.5 - [id number] Intraocular Lens LENS IOL DIOP 19.5 [id number] AMO   Right 1   Procedure(s) with comments: CATARACT EXTRACTION PHACO AND INTRAOCULAR LENS PLACEMENT (IOC) (Right) - [place] 00:35 AP% 14.2 CDE 5.04 fluid pack lot # [id number]  Electronically signed: Kamsiyochukwu Spickler LOUIS 05/03/2016 11:08 AM

## 2016-05-03 NOTE — Anesthesia Preprocedure Evaluation (Addendum)
Anesthesia Evaluation  Patient identified by MRN, date of birth, ID band Patient awake    Reviewed: Allergy & Precautions, NPO status , Patient's Chart, lab work & pertinent test results  History of Anesthesia Complications Negative for: history of anesthetic complications  Airway Mallampati: II  TM Distance: >3 FB Neck ROM: Full    Dental no notable dental hx.    Pulmonary neg pulmonary ROS, neg sleep apnea, neg COPD,    breath sounds clear to auscultation- rhonchi (-) wheezing      Cardiovascular Exercise Tolerance: Good hypertension, Pt. on medications (-) CAD and (-) Past MI + dysrhythmias Atrial Fibrillation  Rhythm:Regular Rate:Normal - Systolic murmurs and - Diastolic murmurs    Neuro/Psych CVA, No Residual Symptoms negative psych ROS   GI/Hepatic negative GI ROS, Neg liver ROS,   Endo/Other  negative endocrine ROSneg diabetes  Renal/GU negative Renal ROS     Musculoskeletal  (+) Arthritis ,   Abdominal (+) - obese,   Peds  Hematology negative hematology ROS (+)   Anesthesia Other Findings Past Medical History: No date: Arthritis No date: Atrial fibrillation (HCC) No date: Dyspnea     Comment: WORKUP NO CAUSE FOUND No date: Dysrhythmia     Comment: Afib No date: Hypertension No date: Stroke [facility])     Comment: POSSIBLE   Reproductive/Obstetrics                             Anesthesia Physical Anesthesia Plan  ASA: II  Anesthesia Plan: MAC   Post-op Pain Management:    Induction: Intravenous  Airway Management Planned: Natural Airway  Additional Equipment:   Intra-op Plan:   Post-operative Plan:   Informed Consent: I have reviewed the patients History and Physical, chart, labs and discussed the procedure including the risks, benefits and alternatives for the proposed anesthesia with the patient or authorized representative who has indicated his/her understanding  and acceptance.     Plan Discussed with: CRNA and Anesthesiologist  Anesthesia Plan Comments:        Anesthesia Quick Evaluation

## 2016-05-03 NOTE — Anesthesia Post-op Follow-up Note (Cosign Needed)
Anesthesia QCDR form completed.        

## 2016-05-03 NOTE — Transfer of Care (Signed)
Immediate Anesthesia Transfer of Care Note  Patient: Thomas Newman  Procedure(s) Performed: Procedure(s) with comments: CATARACT EXTRACTION PHACO AND INTRAOCULAR LENS PLACEMENT (IOC) (Right) - [place] 00:35 AP% 14.2 CDE 5.04 fluid pack lot # [id number]  Patient Location: PACU and Short Stay  Anesthesia Type:MAC  Level of Consciousness: awake, alert  and oriented  Airway & Oxygen Therapy: Patient Spontanous Breathing  Post-op Assessment: Report given to RN and Post -op Vital signs reviewed and stable  Post vital signs: Reviewed and stable  Last Vitals:  Vitals:   05/03/16 1109 05/03/16 1110  BP:    Pulse: (!) 55 (!) 52  Resp: 14 16  Temp: 36.4 C     Last Pain:  Vitals:   05/03/16 1110  TempSrc: Temporal      Patients Stated Pain Goal: 0 (74/12/87 8676)  Complications: No apparent anesthesia complications

## 2016-05-03 NOTE — Anesthesia Postprocedure Evaluation (Signed)
Anesthesia Post Note  Patient: Thomas Newman  Procedure(s) Performed: Procedure(s) (LRB): CATARACT EXTRACTION PHACO AND INTRAOCULAR LENS PLACEMENT (IOC) (Right)  Patient location during evaluation: Short Stay Anesthesia Type: MAC Level of consciousness: awake and alert and oriented Pain management: pain level controlled Vital Signs Assessment: post-procedure vital signs reviewed and stable Respiratory status: spontaneous breathing Cardiovascular status: stable Postop Assessment: no headache Anesthetic complications: no     Last Vitals:  Vitals:   05/03/16 1109 05/03/16 1110  BP: (!) 185/96   Pulse: (!) 55 (!) 52  Resp: 14 16  Temp: 36.4 C     Last Pain:  Vitals:   05/03/16 1110  TempSrc: Temporal                 Lanora Manis

## 2016-05-03 NOTE — Discharge Instructions (Signed)
Eye Surgery Discharge Instructions  Expect mild scratchy sensation or mild soreness. DO NOT RUB YOUR EYE!  The day of surgery:  Minimal physical activity, but bed rest is not required  No reading, computer work, or close hand work  No bending, lifting, or straining.  May watch TV  For 24 hours:  No driving, legal decisions, or alcoholic beverages  Safety precautions  Eat anything you prefer: It is better to start with liquids, then soup then solid foods.  _____ Eye patch should be worn until postoperative exam tomorrow.  ____ Solar shield eyeglasses should be worn for comfort in the sunlight/patch while sleeping  Resume all regular medications including aspirin or Coumadin if these were discontinued prior to surgery. You may shower, bathe, shave, or wash your hair. Tylenol may be taken for mild discomfort.  Call your doctor if you experience significant pain, nausea, or vomiting, fever > 101 or other signs of infection. [phone] or [phone] Specific instructions:  Follow-up Information    PORFILIO,WILLIAM LOUIS, MD Follow up on 05/04/2016.   Specialty:  Ophthalmology Why:  9:20 am Contact information: [address] [place] [address] [phone]

## 2016-06-21 ENCOUNTER — Ambulatory Visit: Admit: 2016-06-21 | Payer: Medicare HMO | Admitting: Ophthalmology

## 2016-06-21 SURGERY — PHACOEMULSIFICATION, CATARACT, WITH IOL INSERTION
Anesthesia: Choice | Laterality: Left

## 2016-07-11 ENCOUNTER — Ambulatory Visit: Payer: Medicare HMO | Admitting: Internal Medicine

## 2017-02-08 ENCOUNTER — Other Ambulatory Visit: Payer: Self-pay | Admitting: Orthopedic Surgery

## 2017-02-08 DIAGNOSIS — M1712 Unilateral primary osteoarthritis, left knee: Secondary | ICD-10-CM

## 2017-02-14 ENCOUNTER — Ambulatory Visit
Admission: RE | Admit: 2017-02-14 | Discharge: 2017-02-14 | Disposition: A | Discharge status: Home / Self Care | Payer: Medicare HMO | Source: Ambulatory Visit | Attending: Orthopedic Surgery | Admitting: Orthopedic Surgery

## 2017-02-14 DIAGNOSIS — M7122 Synovial cyst of popliteal space [Baker], left knee: Secondary | ICD-10-CM | POA: Diagnosis not present

## 2017-02-14 DIAGNOSIS — M1712 Unilateral primary osteoarthritis, left knee: Secondary | ICD-10-CM | POA: Diagnosis present

## 2017-02-14 DIAGNOSIS — M25462 Effusion, left knee: Secondary | ICD-10-CM | POA: Diagnosis not present

## 2017-02-16 ENCOUNTER — Ambulatory Visit: Payer: Medicare HMO

## 2017-04-15 ENCOUNTER — Ambulatory Visit: Payer: Self-pay | Admitting: Orthopedic Surgery

## 2017-05-03 ENCOUNTER — Encounter
Admission: RE | Admit: 2017-05-03 | Discharge: 2017-05-03 | Disposition: A | Discharge status: Home / Self Care | Payer: Medicare HMO | Source: Ambulatory Visit | Attending: Orthopedic Surgery | Admitting: Orthopedic Surgery

## 2017-05-03 ENCOUNTER — Other Ambulatory Visit: Payer: Self-pay

## 2017-05-03 DIAGNOSIS — I4891 Unspecified atrial fibrillation: Secondary | ICD-10-CM | POA: Diagnosis not present

## 2017-05-03 DIAGNOSIS — I10 Essential (primary) hypertension: Secondary | ICD-10-CM | POA: Diagnosis not present

## 2017-05-03 DIAGNOSIS — Z01812 Encounter for preprocedural laboratory examination: Secondary | ICD-10-CM | POA: Insufficient documentation

## 2017-05-03 DIAGNOSIS — Z01810 Encounter for preprocedural cardiovascular examination: Secondary | ICD-10-CM | POA: Insufficient documentation

## 2017-05-03 LAB — URINALYSIS, ROUTINE W REFLEX MICROSCOPIC
Bacteria, UA: NONE SEEN
Bilirubin Urine: NEGATIVE
Glucose, UA: NEGATIVE mg/dL
Hgb urine dipstick: NEGATIVE
Ketones, ur: NEGATIVE mg/dL
LEUKOCYTES UA: NEGATIVE
Nitrite: NEGATIVE
PH: 5 (ref 5.0–8.0)
Protein, ur: 30 mg/dL — AB
SPECIFIC GRAVITY, URINE: 1.02 (ref 1.005–1.030)
SQUAMOUS EPITHELIAL / LPF: NONE SEEN

## 2017-05-03 LAB — TYPE AND SCREEN
ABO/RH(D): A POS
ANTIBODY SCREEN: NEGATIVE

## 2017-05-03 LAB — CBC
HCT: 40.3 % (ref 40.0–52.0)
HEMOGLOBIN: 13.7 g/dL (ref 13.0–18.0)
MCH: 32.1 pg (ref 26.0–34.0)
MCHC: 34.1 g/dL (ref 32.0–36.0)
MCV: 94.3 fL (ref 80.0–100.0)
Platelets: 249 10*3/uL (ref 150–440)
RBC: 4.28 MIL/uL — AB (ref 4.40–5.90)
RDW: 13.5 % (ref 11.5–14.5)
WBC: 6.7 10*3/uL (ref 3.8–10.6)

## 2017-05-03 LAB — BASIC METABOLIC PANEL
ANION GAP: 7 (ref 5–15)
BUN: 30 mg/dL — ABNORMAL HIGH (ref 6–20)
CO2: 30 mmol/L (ref 22–32)
Calcium: 8.9 mg/dL (ref 8.9–10.3)
Chloride: 102 mmol/L (ref 101–111)
Creatinine, Ser: 1.63 mg/dL — ABNORMAL HIGH (ref 0.61–1.24)
GFR, EST AFRICAN AMERICAN: 47 mL/min — AB (ref >60)
GFR, EST NON AFRICAN AMERICAN: 41 mL/min — AB (ref >60)
GLUCOSE: 172 mg/dL — AB (ref 65–99)
POTASSIUM: 3.1 mmol/L — AB (ref 3.5–5.1)
SODIUM: 139 mmol/L (ref 135–145)

## 2017-05-03 LAB — SURGICAL PCR SCREEN
MRSA, PCR: NEGATIVE
STAPHYLOCOCCUS AUREUS: NEGATIVE

## 2017-05-03 LAB — PROTIME-INR
INR: 2.25
PROTHROMBIN TIME: 24.7 s — AB (ref 11.4–15.2)

## 2017-05-03 LAB — APTT: APTT: 38 s — AB (ref 24–36)

## 2017-05-03 NOTE — Patient Instructions (Signed)
Your procedure is scheduled on: 05/15/17 Mon Report to Same Day Surgery 2nd floor medical mall [facility] Entrance-take elevator on left to 2nd floor.  Check in with surgery information desk.) To find out your arrival time please call [phone] between 1PM - 3PM on 05/12/17 Fri Remember: Instructions that are not followed completely may result in serious medical risk, up to and including death, or upon the discretion of your surgeon and anesthesiologist your surgery may need to be rescheduled.    _x___ 1. Do not eat food after midnight the night before your procedure. You may drink clear liquids up to 2 hours before you are scheduled to arrive at the hospital for your procedure.  Do not drink clear liquids within 2 hours of your scheduled arrival to the hospital.  Clear liquids include  --Water or Apple juice without pulp  --Clear carbohydrate beverage such as ClearFast or Gatorade  --Black Coffee or Clear Tea (No milk, no creamers, do not add anything to                  the coffee or Tea Type 1 and type 2 diabetics should only drink water.  No gum chewing or hard candies.     __x__ 2. No Alcohol for 24 hours before or after surgery.   __x__3. No Smoking or e-cigarettes for 24 prior to surgery.  Do not use any chewable tobacco products for at least 6 hour prior to surgery   ____  4. Bring all medications with you on the day of surgery if instructed.    __x__ 5. Notify your doctor if there is any change in your medical condition     (cold, fever, infections).    x___6. On the morning of surgery brush your teeth with toothpaste and water.  You may rinse your mouth with mouth wash if you wish.  Do not swallow any toothpaste or mouthwash.   Do not wear jewelry, make-up, hairpins, clips or nail polish.  Do not wear lotions, powders, or perfumes. You may wear deodorant.  Do not shave 48 hours prior to surgery. Men may shave face and neck.  Do not bring valuables to the hospital.     [facility] is not responsible for any belongings or valuables.               Contacts, dentures or bridgework may not be worn into surgery.  Leave your suitcase in the car. After surgery it may be brought to your room.  For patients admitted to the hospital, discharge time is determined by your                       treatment team.  _  Patients discharged the day of surgery will not be allowed to drive home.  You will need someone to drive you home and stay with you the night of your procedure.    Please read over the following fact sheets that you were given:   [facility] Preparing for Surgery and or MRSA Information   _x___ Take anti-hypertensive listed below, cardiac, seizure, asthma,     anti-reflux and psychiatric medicines. These include:  1. None  2.  3.  4.  5.  6.  ____Fleets enema or Magnesium Citrate as directed.   _x___ Use CHG Soap or sage wipes as directed on instruction sheet   ____ Use inhalers on the day of surgery and bring to hospital day of surgery  ____  Stop Metformin and Janumet 2 days prior to surgery.    ____ Take 1/2 of usual insulin dose the night before surgery and none on the morning     surgery.   _x___ Follow recommendations from Cardiologist, Pulmonologist or PCP regarding          stopping Aspirin, Coumadin, Plavix ,Eliquis, Effient, or Pradaxa, and Pletal.  Stop Xarelto 3 days before surgery  X____Stop Anti-inflammatories such as Advil, Aleve, Ibuprofen, Motrin, Naproxen, Naprosyn, Goodies powders or aspirin products. OK to take Tylenol and                          Celebrex.   _x___ Stop supplements until after surgery.  But may continue Vitamin D, Vitamin B,       and multivitamin.   ____ Bring C-Pap to the hospital.

## 2017-05-03 NOTE — Pre-Procedure Instructions (Signed)
Instructed regarding incentive spirometry. 

## 2017-05-04 NOTE — Pre-Procedure Instructions (Signed)
EKG NOT COMPLETELY VISIBLE  IN Epic. READ BY DR Mariah MillingGOLLAN AND THINKS LEAD PLACEMENT NOT ANTERIOR INFARCT. MANUALLY READ AND SCANNED. CAN BE FOUND IN CHART REVIEW / PAT ENCOUNTER DATE/ MEDIA TAB UNDER SCANNED 05/04/17.

## 2017-05-14 MED ORDER — CEFAZOLIN SODIUM-DEXTROSE 2-4 GM/100ML-% IV SOLN
2.0000 g | INTRAVENOUS | Status: AC
  Filled 2017-05-14: qty 100

## 2017-05-14 MED ORDER — TRANEXAMIC ACID 1000 MG/10ML IV SOLN
1000.0000 mg | INTRAVENOUS | Status: AC
  Administered 2017-05-15: 1000 mg via INTRAVENOUS
  Filled 2017-05-14: qty 10

## 2017-05-15 ENCOUNTER — Encounter
Admission: RE | Disposition: A | Discharge status: Home Health | Payer: Self-pay | Source: Ambulatory Visit | Attending: Orthopedic Surgery

## 2017-05-15 ENCOUNTER — Inpatient Hospital Stay: Payer: Medicare HMO

## 2017-05-15 ENCOUNTER — Inpatient Hospital Stay
Admission: RE | Admit: 2017-05-15 | Discharge: 2017-05-17 | DRG: 470 | Disposition: A | Discharge status: Home Health | Payer: Medicare HMO | Source: Ambulatory Visit | Attending: Orthopedic Surgery | Admitting: Orthopedic Surgery

## 2017-05-15 ENCOUNTER — Inpatient Hospital Stay: Payer: Medicare HMO | Admitting: Registered Nurse

## 2017-05-15 ENCOUNTER — Encounter: Payer: Self-pay | Admitting: *Deleted

## 2017-05-15 ENCOUNTER — Other Ambulatory Visit: Payer: Self-pay

## 2017-05-15 DIAGNOSIS — Z96652 Presence of left artificial knee joint: Secondary | ICD-10-CM

## 2017-05-15 DIAGNOSIS — M25762 Osteophyte, left knee: Secondary | ICD-10-CM | POA: Diagnosis present

## 2017-05-15 DIAGNOSIS — I10 Essential (primary) hypertension: Secondary | ICD-10-CM | POA: Diagnosis present

## 2017-05-15 DIAGNOSIS — Z8673 Personal history of transient ischemic attack (TIA), and cerebral infarction without residual deficits: Secondary | ICD-10-CM

## 2017-05-15 DIAGNOSIS — M1712 Unilateral primary osteoarthritis, left knee: Principal | ICD-10-CM | POA: Diagnosis present

## 2017-05-15 DIAGNOSIS — Z9841 Cataract extraction status, right eye: Secondary | ICD-10-CM

## 2017-05-15 DIAGNOSIS — I482 Chronic atrial fibrillation: Secondary | ICD-10-CM | POA: Diagnosis present

## 2017-05-15 DIAGNOSIS — Z09 Encounter for follow-up examination after completed treatment for conditions other than malignant neoplasm: Secondary | ICD-10-CM

## 2017-05-15 DIAGNOSIS — R339 Retention of urine, unspecified: Secondary | ICD-10-CM | POA: Diagnosis not present

## 2017-05-15 DIAGNOSIS — Z961 Presence of intraocular lens: Secondary | ICD-10-CM | POA: Diagnosis present

## 2017-05-15 HISTORY — PX: TOTAL KNEE ARTHROPLASTY: SHX125

## 2017-05-15 LAB — POCT I-STAT 4, (NA,K, GLUC, HGB,HCT)
Glucose, Bld: 105 mg/dL — ABNORMAL HIGH (ref 65–99)
HCT: 39 % (ref 39.0–52.0)
Hemoglobin: 13.3 g/dL (ref 13.0–17.0)
Potassium: 3.8 mmol/L (ref 3.5–5.1)
SODIUM: 141 mmol/L (ref 135–145)

## 2017-05-15 LAB — ABO/RH: ABO/RH(D): A POS

## 2017-05-15 SURGERY — ARTHROPLASTY, KNEE, TOTAL
Anesthesia: General | Site: Knee | Laterality: Left | Wound class: Clean

## 2017-05-15 MED ORDER — ACETAMINOPHEN 500 MG PO TABS
1000.0000 mg | ORAL_TABLET | Freq: Once | ORAL | Status: AC
  Administered 2017-05-15: 1000 mg via ORAL

## 2017-05-15 MED ORDER — MIDAZOLAM HCL 2 MG/2ML IJ SOLN
INTRAMUSCULAR | Status: AC
  Administered 2017-05-15: 1 mg via INTRAVENOUS
  Filled 2017-05-15: qty 2

## 2017-05-15 MED ORDER — ONDANSETRON HCL 4 MG/2ML IJ SOLN: 4.0000 mg | Freq: Once | INTRAMUSCULAR | Status: DC | PRN

## 2017-05-15 MED ORDER — LIDOCAINE HCL (CARDIAC) PF 100 MG/5ML IV SOSY
PREFILLED_SYRINGE | INTRAVENOUS | Status: DC | PRN
  Administered 2017-05-15: 100 mg via INTRAVENOUS

## 2017-05-15 MED ORDER — KETOROLAC TROMETHAMINE 15 MG/ML IJ SOLN
7.5000 mg | Freq: Four times a day (QID) | INTRAMUSCULAR | Status: AC
  Administered 2017-05-15 – 2017-05-16 (×3): 7.5 mg via INTRAVENOUS
  Filled 2017-05-15 (×5): qty 1

## 2017-05-15 MED ORDER — DOCUSATE SODIUM 100 MG PO CAPS
100.0000 mg | ORAL_CAPSULE | Freq: Two times a day (BID) | ORAL | Status: DC
  Administered 2017-05-15 – 2017-05-17 (×5): 100 mg via ORAL
  Filled 2017-05-15 (×5): qty 1

## 2017-05-15 MED ORDER — ACETAMINOPHEN 500 MG PO TABS
500.0000 mg | ORAL_TABLET | Freq: Four times a day (QID) | ORAL | Status: AC
  Administered 2017-05-15 – 2017-05-16 (×4): 500 mg via ORAL
  Filled 2017-05-15 (×4): qty 1

## 2017-05-15 MED ORDER — RIVAROXABAN 20 MG PO TABS
20.0000 mg | ORAL_TABLET | Freq: Every day | ORAL | Status: DC
  Administered 2017-05-16 – 2017-05-17 (×2): 20 mg via ORAL
  Filled 2017-05-15 (×2): qty 1

## 2017-05-15 MED ORDER — CHLORHEXIDINE GLUCONATE 4 % EX LIQD: 60.0000 mL | Freq: Once | CUTANEOUS | Status: DC

## 2017-05-15 MED ORDER — CEFAZOLIN SODIUM-DEXTROSE 2-3 GM-%(50ML) IV SOLR
INTRAVENOUS | Status: AC
  Filled 2017-05-15: qty 50

## 2017-05-15 MED ORDER — FAMOTIDINE 20 MG PO TABS
20.0000 mg | ORAL_TABLET | Freq: Once | ORAL | Status: AC
  Administered 2017-05-15: 20 mg via ORAL

## 2017-05-15 MED ORDER — BUPIVACAINE HCL (PF) 0.5 % IJ SOLN
INTRAMUSCULAR | Status: AC
  Filled 2017-05-15: qty 10

## 2017-05-15 MED ORDER — SODIUM CHLORIDE 0.9 % IR SOLN
Status: DC | PRN
  Administered 2017-05-15: 2000 mL

## 2017-05-15 MED ORDER — PHENOL 1.4 % MT LIQD
1.0000 | OROMUCOSAL | Status: DC | PRN
  Filled 2017-05-15: qty 177

## 2017-05-15 MED ORDER — METOCLOPRAMIDE HCL 5 MG/ML IJ SOLN: 5.0000 mg | Freq: Three times a day (TID) | INTRAMUSCULAR | Status: DC | PRN

## 2017-05-15 MED ORDER — GABAPENTIN 300 MG PO CAPS
300.0000 mg | ORAL_CAPSULE | Freq: Once | ORAL | Status: AC
  Administered 2017-05-15: 300 mg via ORAL

## 2017-05-15 MED ORDER — MIDAZOLAM HCL 2 MG/2ML IJ SOLN
1.0000 mg | Freq: Once | INTRAMUSCULAR | Status: AC
  Administered 2017-05-15: 1 mg via INTRAVENOUS

## 2017-05-15 MED ORDER — BUPIVACAINE HCL (PF) 0.5 % IJ SOLN
INTRAMUSCULAR | Status: DC | PRN
  Administered 2017-05-15: 3 mL

## 2017-05-15 MED ORDER — LACTATED RINGERS IV SOLN
INTRAVENOUS | Status: DC
  Administered 2017-05-15: 07:00:00 via INTRAVENOUS

## 2017-05-15 MED ORDER — LIDOCAINE HCL URETHRAL/MUCOSAL 2 % EX GEL
CUTANEOUS | Status: AC
  Filled 2017-05-15: qty 10

## 2017-05-15 MED ORDER — ACETAMINOPHEN 325 MG PO TABS: 325.0000 mg | ORAL_TABLET | Freq: Four times a day (QID) | ORAL | Status: DC | PRN

## 2017-05-15 MED ORDER — NITROGLYCERIN 0.3 MG SL SUBL
0.3000 mg | SUBLINGUAL_TABLET | SUBLINGUAL | Status: DC | PRN
  Filled 2017-05-15: qty 100

## 2017-05-15 MED ORDER — FAMOTIDINE 20 MG PO TABS
ORAL_TABLET | ORAL | Status: AC
  Administered 2017-05-15: 20 mg via ORAL
  Filled 2017-05-15: qty 1

## 2017-05-15 MED ORDER — LABETALOL HCL 5 MG/ML IV SOLN
INTRAVENOUS | Status: AC
  Administered 2017-05-15: 10 mg via INTRAVENOUS
  Filled 2017-05-15: qty 4

## 2017-05-15 MED ORDER — ONDANSETRON HCL 4 MG/2ML IJ SOLN: 4.0000 mg | Freq: Four times a day (QID) | INTRAMUSCULAR | Status: DC | PRN

## 2017-05-15 MED ORDER — METOCLOPRAMIDE HCL 10 MG PO TABS: 5.0000 mg | ORAL_TABLET | Freq: Three times a day (TID) | ORAL | Status: DC | PRN

## 2017-05-15 MED ORDER — MORPHINE SULFATE (PF) 2 MG/ML IV SOLN: 0.5000 mg | INTRAVENOUS | Status: DC | PRN

## 2017-05-15 MED ORDER — POTASSIUM CHLORIDE CRYS ER 20 MEQ PO TBCR
20.0000 meq | EXTENDED_RELEASE_TABLET | Freq: Two times a day (BID) | ORAL | Status: DC
  Administered 2017-05-16 – 2017-05-17 (×3): 20 meq via ORAL
  Filled 2017-05-15 (×3): qty 1

## 2017-05-15 MED ORDER — PROPOFOL 500 MG/50ML IV EMUL
INTRAVENOUS | Status: DC | PRN
  Administered 2017-05-15: 75 ug/kg/min via INTRAVENOUS

## 2017-05-15 MED ORDER — BISACODYL 10 MG RE SUPP: 10.0000 mg | Freq: Every day | RECTAL | Status: DC | PRN

## 2017-05-15 MED ORDER — CEFAZOLIN SODIUM-DEXTROSE 2-4 GM/100ML-% IV SOLN
2.0000 g | Freq: Four times a day (QID) | INTRAVENOUS | Status: AC
  Administered 2017-05-15 (×2): 2 g via INTRAVENOUS
  Filled 2017-05-15 (×2): qty 100

## 2017-05-15 MED ORDER — ACETAMINOPHEN 500 MG PO TABS
ORAL_TABLET | ORAL | Status: AC
  Administered 2017-05-15: 1000 mg via ORAL
  Filled 2017-05-15: qty 2

## 2017-05-15 MED ORDER — PROPOFOL 500 MG/50ML IV EMUL
INTRAVENOUS | Status: AC
  Filled 2017-05-15: qty 50

## 2017-05-15 MED ORDER — BUPIVACAINE-EPINEPHRINE (PF) 0.5% -1:200000 IJ SOLN
INTRAMUSCULAR | Status: DC | PRN
  Administered 2017-05-15: 30 mL via PERINEURAL

## 2017-05-15 MED ORDER — SODIUM CHLORIDE 0.9 % IJ SOLN
INTRAMUSCULAR | Status: AC
  Filled 2017-05-15: qty 10

## 2017-05-15 MED ORDER — BUPIVACAINE-EPINEPHRINE (PF) 0.5% -1:200000 IJ SOLN
INTRAMUSCULAR | Status: AC
  Filled 2017-05-15: qty 30

## 2017-05-15 MED ORDER — SODIUM CHLORIDE 0.9 % IV SOLN
INTRAVENOUS | Status: DC | PRN
  Administered 2017-05-15: 60 mL

## 2017-05-15 MED ORDER — LABETALOL HCL 5 MG/ML IV SOLN
10.0000 mg | Freq: Once | INTRAVENOUS | Status: AC
  Administered 2017-05-15: 10 mg via INTRAVENOUS

## 2017-05-15 MED ORDER — MAGNESIUM CITRATE PO SOLN
1.0000 | Freq: Once | ORAL | Status: DC | PRN
  Filled 2017-05-15: qty 296

## 2017-05-15 MED ORDER — TRAMADOL HCL 50 MG PO TABS
50.0000 mg | ORAL_TABLET | Freq: Four times a day (QID) | ORAL | Status: DC
  Administered 2017-05-15 – 2017-05-17 (×9): 50 mg via ORAL
  Filled 2017-05-15 (×9): qty 1

## 2017-05-15 MED ORDER — MIDAZOLAM HCL 2 MG/2ML IJ SOLN
INTRAMUSCULAR | Status: AC
  Filled 2017-05-15: qty 2

## 2017-05-15 MED ORDER — ONDANSETRON HCL 4 MG PO TABS: 4.0000 mg | ORAL_TABLET | Freq: Four times a day (QID) | ORAL | Status: DC | PRN

## 2017-05-15 MED ORDER — TAMSULOSIN HCL 0.4 MG PO CAPS
0.4000 mg | ORAL_CAPSULE | Freq: Every day | ORAL | Status: DC
  Administered 2017-05-15 – 2017-05-17 (×3): 0.4 mg via ORAL
  Filled 2017-05-15 (×3): qty 1

## 2017-05-15 MED ORDER — LIDOCAINE HCL (PF) 1 % IJ SOLN
INTRAMUSCULAR | Status: AC
  Filled 2017-05-15: qty 5

## 2017-05-15 MED ORDER — HYDROCODONE-ACETAMINOPHEN 7.5-325 MG PO TABS: 1.0000 | ORAL_TABLET | ORAL | Status: DC | PRN

## 2017-05-15 MED ORDER — BUPIVACAINE LIPOSOME 1.3 % IJ SUSP
INTRAMUSCULAR | Status: AC
  Filled 2017-05-15: qty 20

## 2017-05-15 MED ORDER — GABAPENTIN 300 MG PO CAPS
ORAL_CAPSULE | ORAL | Status: AC
  Administered 2017-05-15: 300 mg via ORAL
  Filled 2017-05-15: qty 1

## 2017-05-15 MED ORDER — HYDROCHLOROTHIAZIDE 25 MG PO TABS
25.0000 mg | ORAL_TABLET | Freq: Every day | ORAL | Status: DC
  Administered 2017-05-16 – 2017-05-17 (×2): 25 mg via ORAL
  Filled 2017-05-15 (×2): qty 1

## 2017-05-15 MED ORDER — HYDROCODONE-ACETAMINOPHEN 5-325 MG PO TABS
1.0000 | ORAL_TABLET | ORAL | Status: DC | PRN
  Administered 2017-05-16 – 2017-05-17 (×2): 2 via ORAL
  Administered 2017-05-17: 1 via ORAL
  Filled 2017-05-15: qty 2
  Filled 2017-05-15: qty 1
  Filled 2017-05-15: qty 2

## 2017-05-15 MED ORDER — ONDANSETRON HCL 4 MG/2ML IJ SOLN
INTRAMUSCULAR | Status: DC | PRN
  Administered 2017-05-15: 4 mg via INTRAVENOUS

## 2017-05-15 MED ORDER — FENTANYL CITRATE (PF) 100 MCG/2ML IJ SOLN: 25.0000 ug | INTRAMUSCULAR | Status: DC | PRN

## 2017-05-15 MED ORDER — LACTATED RINGERS IV SOLN
INTRAVENOUS | Status: DC
  Administered 2017-05-15 (×2): via INTRAVENOUS

## 2017-05-15 MED ORDER — PROPOFOL 10 MG/ML IV BOLUS
INTRAVENOUS | Status: AC
  Filled 2017-05-15: qty 20

## 2017-05-15 MED ORDER — PHENYLEPHRINE HCL 10 MG/ML IJ SOLN
INTRAMUSCULAR | Status: DC | PRN
  Administered 2017-05-15: 100 ug via INTRAVENOUS

## 2017-05-15 MED ORDER — ONDANSETRON HCL 4 MG/2ML IJ SOLN
INTRAMUSCULAR | Status: AC
  Filled 2017-05-15: qty 2

## 2017-05-15 MED ORDER — NITROGLYCERIN 0.4 MG SL SUBL: 0.4000 mg | SUBLINGUAL_TABLET | SUBLINGUAL | Status: DC | PRN

## 2017-05-15 MED ORDER — GABAPENTIN 300 MG PO CAPS
300.0000 mg | ORAL_CAPSULE | Freq: Three times a day (TID) | ORAL | Status: DC
  Administered 2017-05-15 – 2017-05-17 (×6): 300 mg via ORAL
  Filled 2017-05-15 (×6): qty 1

## 2017-05-15 MED ORDER — SODIUM CHLORIDE 0.9 % IJ SOLN
INTRAMUSCULAR | Status: AC
  Filled 2017-05-15: qty 50

## 2017-05-15 MED ORDER — SODIUM CHLORIDE 0.9 % IV SOLN
INTRAVENOUS | Status: DC | PRN
  Administered 2017-05-15: 20 ug/min via INTRAVENOUS

## 2017-05-15 MED ORDER — PROPOFOL 10 MG/ML IV BOLUS
INTRAVENOUS | Status: DC | PRN
  Administered 2017-05-15: 50 mg via INTRAVENOUS

## 2017-05-15 MED ORDER — BUPIVACAINE HCL (PF) 0.5 % IJ SOLN
INTRAMUSCULAR | Status: AC
  Filled 2017-05-15: qty 20

## 2017-05-15 MED ORDER — MIDAZOLAM HCL 5 MG/5ML IJ SOLN
INTRAMUSCULAR | Status: DC | PRN
  Administered 2017-05-15 (×2): 1 mg via INTRAVENOUS

## 2017-05-15 MED ORDER — PROPOFOL 10 MG/ML IV BOLUS
INTRAVENOUS | Status: AC
Start: 2017-05-15 — End: 2017-05-15
  Filled 2017-05-15: qty 20

## 2017-05-15 MED ORDER — MENTHOL 3 MG MT LOZG
1.0000 | LOZENGE | OROMUCOSAL | Status: DC | PRN
  Filled 2017-05-15: qty 9

## 2017-05-15 MED ORDER — BACITRACIN 50000 UNITS IM SOLR
INTRAMUSCULAR | Status: AC
  Filled 2017-05-15: qty 2

## 2017-05-15 MED ORDER — CEFAZOLIN SODIUM-DEXTROSE 2-3 GM-%(50ML) IV SOLR
INTRAVENOUS | Status: DC | PRN
  Administered 2017-05-15: 2 g via INTRAVENOUS

## 2017-05-15 MED ORDER — BUPIVACAINE HCL (PF) 0.5 % IJ SOLN
INTRAMUSCULAR | Status: AC
Start: 2017-05-15 — End: 2017-05-15
  Filled 2017-05-15: qty 10

## 2017-05-15 SURGICAL SUPPLY — 52 items
BLADE SAW 1 (BLADE) ×3 IMPLANT
BLADE SAW 1/2 (BLADE) ×3 IMPLANT
BOWL CEMENT MIX W/ADAPTER (MISCELLANEOUS) ×3 IMPLANT
BRUSH SCRUB EZ  4% CHG (MISCELLANEOUS) ×4
BRUSH SCRUB EZ 4% CHG (MISCELLANEOUS) ×2 IMPLANT
CANISTER SUCT 1200ML W/VALVE (MISCELLANEOUS) ×3 IMPLANT
CANISTER SUCT 3000ML PPV (MISCELLANEOUS) ×6 IMPLANT
CAPT KNEE TOTAL 3 ATTUNE ×3 IMPLANT
CEMENT BONE 1-PACK (Cement) ×6 IMPLANT
CHLORAPREP W/TINT 26ML (MISCELLANEOUS) ×6 IMPLANT
COOLER POLAR GLACIER W/PUMP (MISCELLANEOUS) ×3 IMPLANT
CUFF TOURN 24 STER (MISCELLANEOUS) ×3 IMPLANT
CUFF TOURN 30 STER DUAL PORT (MISCELLANEOUS) IMPLANT
DRAPE INCISE IOBAN 66X60 STRL (DRAPES) ×3 IMPLANT
DRAPE SHEET LG 3/4 BI-LAMINATE (DRAPES) ×3 IMPLANT
DRSG AQUACEL AG ADV 3.5X14 (GAUZE/BANDAGES/DRESSINGS) ×3 IMPLANT
ELECT REM PT RETURN 9FT ADLT (ELECTROSURGICAL) ×3
ELECTRODE REM PT RTRN 9FT ADLT (ELECTROSURGICAL) ×1 IMPLANT
GAUZE PETRO XEROFOAM 1X8 (MISCELLANEOUS) ×3 IMPLANT
GLOVE INDICATOR 8.0 STRL GRN (GLOVE) ×3 IMPLANT
GLOVE SURG ORTHO 8.0 STRL STRW (GLOVE) ×9 IMPLANT
GOWN STRL REUS W/ TWL LRG LVL3 (GOWN DISPOSABLE) ×1 IMPLANT
GOWN STRL REUS W/ TWL XL LVL3 (GOWN DISPOSABLE) ×1 IMPLANT
GOWN STRL REUS W/TWL LRG LVL3 (GOWN DISPOSABLE) ×2
GOWN STRL REUS W/TWL XL LVL3 (GOWN DISPOSABLE) ×2
GUIDEPIN TRUMATCH LEFT (PIN) ×3 IMPLANT
HOOD PEEL AWAY FLYTE STAYCOOL (MISCELLANEOUS) ×9 IMPLANT
IV NS 1000ML (IV SOLUTION) ×2
IV NS 1000ML BAXH (IV SOLUTION) ×1 IMPLANT
KIT TURNOVER KIT A (KITS) ×3 IMPLANT
MAT ABSORB  FLUID 56X50 GRAY (MISCELLANEOUS) ×2
MAT ABSORB FLUID 56X50 GRAY (MISCELLANEOUS) ×1 IMPLANT
NDL SAFETY ECLIPSE 18X1.5 (NEEDLE) ×1 IMPLANT
NEEDLE HYPO 18GX1.5 SHARP (NEEDLE) ×2
NEEDLE SPNL 20GX3.5 QUINCKE YW (NEEDLE) ×3 IMPLANT
NS IRRIG 1000ML POUR BTL (IV SOLUTION) ×3 IMPLANT
PACK TOTAL KNEE (MISCELLANEOUS) ×3 IMPLANT
PAD DE MAYO PRESSURE PROTECT (MISCELLANEOUS) ×6 IMPLANT
PAD WRAPON POLAR KNEE (MISCELLANEOUS) ×1 IMPLANT
PULSAVAC PLUS IRRIG FAN TIP (DISPOSABLE) ×3
STAPLER SKIN PROX 35W (STAPLE) ×3 IMPLANT
SUCTION FRAZIER HANDLE 10FR (MISCELLANEOUS) ×2
SUCTION TUBE FRAZIER 10FR DISP (MISCELLANEOUS) ×1 IMPLANT
SUT DVC 2 QUILL PDO  T11 36X36 (SUTURE) ×2
SUT DVC 2 QUILL PDO T11 36X36 (SUTURE) ×1 IMPLANT
SUT VIC AB 2-0 CT1 18 (SUTURE) ×3 IMPLANT
SUT VIC AB 2-0 CT1 27 (SUTURE) ×2
SUT VIC AB 2-0 CT1 TAPERPNT 27 (SUTURE) ×1 IMPLANT
SUT VIC AB PLUS 45CM 1-MO-4 (SUTURE) ×3 IMPLANT
SYR 30ML LL (SYRINGE) ×9 IMPLANT
TIP FAN IRRIG PULSAVAC PLUS (DISPOSABLE) ×1 IMPLANT
WRAPON POLAR PAD KNEE (MISCELLANEOUS) ×3

## 2017-05-15 NOTE — Progress Notes (Signed)
Pt arrived to room 148. Skin assessment completed with Marijean NiemannJaime, RN. SCDs on and running. IV infusing LR at 75 ml. Pt on room air. Pt denies pain at this time. Family at bedside. Pt oriented to unit. Call bell and phone within reach.

## 2017-05-15 NOTE — Transfer of Care (Signed)
Immediate Anesthesia Transfer of Care Note  Patient: Thomas KillingsJohn G Lewison  Procedure(s) Performed: TOTAL KNEE ARTHROPLASTY (Left Knee)  Patient Location: PACU  Anesthesia Type:General and Spinal  Level of Consciousness: sedated, arousable   Airway & Oxygen Therapy: Patient Spontanous Breathing2  Post-op Assessment: Report given to RN  Post vital signs: stable  Last Vitals:  Vitals Value Taken Time  BP 125/86 05/15/2017  9:43 AM  Temp 36.6 C 05/15/2017  9:43 AM  Pulse 70 05/15/2017  9:50 AM  Resp 18 05/15/2017  9:50 AM  SpO2 100 % 05/15/2017  9:50 AM  Vitals shown include unvalidated device data.  Last Pain:  Vitals:   05/15/17 0947  TempSrc: Temporal  PainSc:          Complications: No apparent anesthesia complications

## 2017-05-15 NOTE — Evaluation (Signed)
Physical Therapy Evaluation Patient Details Name: Thomas Newman MRN: [id number] DOB: 1945-03-22 Today's Date: 05/15/2017   History of Present Illness  Pt is a 72 yo M diagnosed with OA of the L knee and is s/p elective L TKA.  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    Clinical Impression  Pt presents with minor deficits in strength, transfers, mobility, L knee ROM, gait, balance, and activity tolerance but did very well overall especially considering POD 0 status.  Pt required only extra time and effort and use of a rail during bed mobility tasks but no physical assistance.  Pt presented with good eccentric and concentric control with transfers and was steady during limited ambulation in the room with only slight increase in L knee pain from 0/10 to 2/10.  I expect pt to progress very well during his time in acute care and pt will benefit from HHPT services upon discharge to safely address above deficits for decreased caregiver assistance and eventual return to PLOF.      Follow Up Recommendations Home health PT    Equipment Recommendations  None recommended by PT    Recommendations for Other Services       Precautions / Restrictions Precautions Precautions: Knee;Fall Restrictions Weight Bearing Restrictions: Yes LLE Weight Bearing: Weight bearing as tolerated      Mobility  Bed Mobility Overal bed mobility: Modified Independent             General bed mobility comments: Extra time and effort and use of bed rail during bed mobility tasks but no physical assistance required  Transfers Overall transfer level: Needs assistance Equipment used: Rolling walker (2 wheeled) Transfers: Sit to/from Stand Sit to Stand: Min guard         General transfer comment: Good eccentric and concentric control with mod verbal and visual cues for sequencing   Ambulation/Gait Ambulation/Gait assistance: Min  guard Ambulation Distance (Feet): 10 Feet Assistive device: Rolling walker (2 wheeled) Gait Pattern/deviations: Step-through pattern;Decreased step length - right;Decreased stance time - left;Antalgic Gait velocity: Decreased   General Gait Details: Pt steady during amb with slow cadence and only mildly antalgic on the LLE  Stairs            Wheelchair Mobility    Modified Rankin (Stroke Patients Only)       Balance Overall balance assessment: Needs assistance   Sitting balance-Leahy Scale: Good     Standing balance support: Bilateral upper extremity supported Standing balance-Leahy Scale: Good                               Pertinent Vitals/Pain Pain Assessment: 0-10 Pain Score: 2  Pain Location: No pain at rest, 2/10 L knee pain with ambulation Pain Descriptors / Indicators: Sore Pain Intervention(s): Monitored during session;Premedicated before session    Home Living Family/patient expects to be discharged to:: Private residence Living Arrangements: Spouse/significant other Available Help at Discharge: Family;Available 24 hours/day Type of Home: House Home Access: Stairs to enter Entrance Stairs-Rails: Left Entrance Stairs-Number of Steps: 5 Home Layout: One level Home Equipment: Walker - 2 wheels;Walker - 4 wheels;Bedside commode      Prior Function Level of Independence: Independent with assistive device(s)         Comments: Ind Amb household distances, SPC in the community, no fall history, Ind with ADLs     Hand Dominance  Dominant Hand: Right    Extremity/Trunk Assessment   Upper Extremity Assessment Upper Extremity Assessment: Overall WFL for tasks assessed    Lower Extremity Assessment Lower Extremity Assessment: Generalized weakness;LLE deficits/detail LLE Deficits / Details: 3/5 L hip flex with pt able to perform Ind SLR without extensor lag, L ankle DF 3/5 and PF strong against manual resistance LLE: Unable to fully  assess due to pain LLE Sensation: WNL(Light touch and proprioception intact to BLEs)       Communication   Communication: No difficulties  Cognition Arousal/Alertness: Awake/alert Behavior During Therapy: WFL for tasks assessed/performed Overall Cognitive Status: Within Functional Limits for tasks assessed                                        General Comments      Exercises Total Joint Exercises Ankle Circles/Pumps: AROM;Both;10 reps Quad Sets: Strengthening;Both;5 reps;10 reps Gluteal Sets: Strengthening;Both;10 reps Hip ABduction/ADduction: AROM;Both;5 reps Straight Leg Raises: AROM;Both;5 reps Long Arc Quad: AROM;Both;10 reps;15 reps Knee Flexion: AROM;Both;10 reps;15 reps Goniometric ROM: L knee AROM: 0-101 deg Marching in Standing: AROM;Both;10 reps Other Exercises Other Exercises: Sit to/from stand transfer training from various seat heights with verbal and visual cues for proper sequencing Other Exercises: HEP education and review per TKA handout provided with pt and spouse Other Exercises: Positioning education provided with pt and spouse to encourage L knee ext while in bed and in recliner   Assessment/Plan    PT Assessment Patient needs continued PT services  PT Problem List Decreased strength;Decreased range of motion;Decreased activity tolerance;Decreased balance;Decreased mobility;Decreased knowledge of use of DME       PT Treatment Interventions DME instruction;Gait training;Stair training;Functional mobility training;Balance training;Therapeutic activities;Therapeutic exercise;Patient/family education    PT Goals (Current goals can be found in the Care Plan section)  Acute Rehab PT Goals Patient Stated Goal: To be able to walk on trails outdoors PT Goal Formulation: With patient Time For Goal Achievement: 05/28/17 Potential to Achieve Goals: Fair    Frequency BID   Barriers to discharge        Co-evaluation                AM-PAC PT "6 Clicks" Daily Activity  Outcome Measure Difficulty turning over in bed (including adjusting bedclothes, sheets and blankets)?: A Little Difficulty moving from lying on back to sitting on the side of the bed? : A Little Difficulty sitting down on and standing up from a chair with arms (e.g., wheelchair, bedside commode, etc,.)?: Unable Help needed moving to and from a bed to chair (including a wheelchair)?: A Little Help needed walking in hospital room?: A Little Help needed climbing 3-5 steps with a railing? : A Little 6 Click Score: 16    End of Session Equipment Utilized During Treatment: Gait belt Activity Tolerance: Patient tolerated treatment well Patient left: in chair;with chair alarm set;with SCD's reapplied;with call bell/phone within reach;with family/visitor present;Other (comment)(Polar care donned to L knee) Nurse Communication: Mobility status PT Visit Diagnosis: Other abnormalities of gait and mobility (R26.89);Muscle weakness (generalized) (M62.81)    Time: [id number] PT Time Calculation (min) (ACUTE ONLY): 67 min   Charges:   PT Evaluation $PT Eval Low Complexity: 1 Low PT Treatments $Therapeutic Exercise: 8-22 mins $Therapeutic Activity: 8-22 mins   PT G Codes:        Elly Modena. Scott Emary Zalar PT, DPT 05/15/17, 5:08 PM

## 2017-05-15 NOTE — Op Note (Signed)
DATE OF SURGERY:  05/15/2017 TIME: 9:58 AM  PATIENT NAME:  Thomas Newman   AGE: 72 y.o.    PRE-OPERATIVE DIAGNOSIS:  M17.12 Unilateral primary osteoarthritis, left knee  POST-OPERATIVE DIAGNOSIS:  Same  PROCEDURE:  Procedure(s): TOTAL KNEE ARTHROPLASTY  SURGEON:  Lyndle HerrlichJames R Izek Corvino, MD   ASSISTANT:  Hurshel KeysAlex Meloy, PA-C  OPERATIVE IMPLANTS: Depuy Sigma, Cruciate Retaining Femoral component size  4, Sigma Fixed Bearing Tray size 3, Patella polyethylene 3-peg oval button size 35 mm, with a 10 mm polyethylene Curve-Plus insert.   PREOPERATIVE INDICATIONS:  Thomas Newman is an 72 y.o. male who has a diagnosis of <principal problem not specified> and elected for a left total knee arthroplasty after failing nonoperative treatment, including activity modification, pain medication, physical therapy and injections who has significant impairment of their activities of daily living.  Radiographs have demonstrated tricompartmental osteoarthritis joint space narrowing, osteophytes, subchondral sclerosis and cyst formation.  The risks, benefits, and alternatives were discussed at length including but not limited to the risks of infection, bleeding, nerve or blood vessel injury, knee stiffness, fracture, dislocation, loosening or failure of the hardware and the need for further surgery. Medical risks include but not limited to DVT and pulmonary embolism, myocardial infarction, stroke, pneumonia, respiratory failure and death. I discussed these risks with the patient in my office prior to the date of surgery. They understood these risks and were willing to proceed.  OPERATIVE FINDINGS AND UNIQUE ASPECTS OF THE CASE:  All three compartments with advanced and severe degenerative changes, large osteophytes and an abundance of synovial fluid. Significant deformity was also noted. A decision was made to proceed with total knee arthroplasty.   OPERATIVE DESCRIPTION:  The patient was brought to the operative room and  placed in a supine position after undergoing placement of a general anesthetic. IV antibiotics were given. Patient received tranexamic acid. The lower extremity was prepped and draped in the usual sterile fashion.  A time out was performed to verify the patient's name, date of birth, medical record number, correct site of surgery and correct procedure to be performed. The timeout was also used to confirm the patient received antibiotics and that appropriate instruments, implants and radiographs studies were available in the room.  The leg was elevated and exsanguinated with an Esmarch and the tourniquet was inflated to 265 mmHg.  A midline incision was made over the left knee.. A medial parapatellar arthrotomy was then made and the patella subluxed laterally and the knee was brought into 90 of flexion. Hoffa's fat pad along with the anterior cruciate ligament was resected and the medial joint line was exposed.  Attention was then turned to preparation of the patella. The thickness of the patella was measured with a caliper, the diameter measured with the patella templates.  The patella resection was then made with an oscillating saw using the patella cutting guide.  The 35 mm button fit appropriately.  3 peg holes for the patella component were then drilled.  The extramedullary tibial cutting guide was then placed using the anterior tibial crest and second ray of the foot as a reference.  The tibial cutting guide was adjusted to allow for appropriate posterior slope.  The tibial cutting block was pinned into position. The slotted stylus was used to measure the proximal tibial resection of 2 mm off the low medial side. Care was taken during the tibial resection to protect the medial and collateral ligaments.  The resected tibial bone was removed.  The distal  femur was resected using the TruMatch cutting guide.  Care was taken to protect the collateral ligaments during distal femoral resection.  The distal  femoral resection was performed with an oscillating saw. The femoral cutting guide was then removed. Extension gap was measured with a 10 mm spacer block and alignment and extension was confirmed using a long alignment rod. The femur was sized to be a 4. Rotation of the referencing guide was checked with the epicondylar axis and Whitesides line. Then the 4-in-1 cutting jig was then applied to the distal femur. A stylus was used to confirm that the anterior femur would not be notched.   Then the anterior, posterior and chamfer femoral cuts were then made with an oscillating saw.  All posterior osteophytes were removed.  The flexion gap was then measured with a flexion spacer block and long alignment rod and was found to be symmetric with the extension gap and perpendicular to mechanical axis of the tibia.  The proximal tibia plateau was then sized with trial trays. The best coverage was achieved with a size 3. This tibial tray was then pinned into position. The proximal tibia was then prepared with the reamer and keel punch.  After tibial preparation was completed, all trial components were inserted with polyethylene trials.  The knee was found to have excellent balance and full motion with a size 10 mm tibial polyethylene insert..    The trials were then placed. Knee was taken through a full range of motion and deemed to be stable with the trial components. All trial components were then removed.  The joint was copiously irrigated with pulse lavage.  The final total knee arthroplasty components were then cemented into place. The knee was held in extension while cement was allowed to cure.The knee was taken through a range of motion and the patella tracked well and the knee was again irrigated copiously.  The knee capsule was then injected with Exparel.  The medial arthrotomy was closed with #1 Vicryl and #2 Quill. The subcutaneous tissue closed with  2-0 vicryl, and skin approximated with staples.  A dry  sterile and compressive dressing was applied.  A Polar Care was applied to the operative knee.  The patient was awakened and brought to the PACU in stable and satisfactory condition.  All sharp, lap and instrument counts were correct at the conclusion the case. I spoke with the patient's family in the postop consultation room to let them know the case had been performed without complication and the patient was stable in recovery room.   Total tourniquet time was 57 minutes.

## 2017-05-15 NOTE — Anesthesia Procedure Notes (Signed)
Anesthesia Regional Block: Adductor canal block   Pre-Anesthetic Checklist: ,, timeout performed, Correct Patient, Correct Site, Correct Laterality, Correct Procedure, Correct Position, site marked, Risks and benefits discussed,  Surgical consent,  Pre-op evaluation,  At surgeon's request and post-op pain management  Laterality: Left  Prep: Dura Prep       Needles:  Injection technique: Single-shot  Needle Type: Echogenic Needle     Needle Length: 10cm  Needle Gauge: 20     Additional Needles:   Procedures:,,,, ultrasound used (permanent image in chart),,,,  Narrative:  Injection made incrementally with aspirations every 5 mL.  Performed by: Personally  Anesthesiologist: Yevette EdwardsAdams, James G, MD  Additional Notes: Request for adductor canal block per surgeon for post op pain.  In pacu with evolving return of sensation.  30 cc bupiv plain with above needle tolerated well with no iv sx and easy low pressure injection.  Vsst.  Fawn KirkJA

## 2017-05-15 NOTE — Progress Notes (Signed)
Pt BP elevated. Dr. Pernell DupreAdams notified. Acknowledged. Orders received. Adora Yeh E 6:56 AM 05/15/2017

## 2017-05-15 NOTE — NC FL2 (Signed)
[place] MEDICAID FL2 LEVEL OF CARE SCREENING TOOL     IDENTIFICATION  Patient Name: Thomas Newman Birthdate: 02-01-1945 Sex: male Admission Date (Current Location): 05/15/2017  [place] Number:  Chiropodist and Address:  [facility], [address] [place], [place] [address]      Provider Number: [id number]  Attending Physician Name and Address:  Lyndle Herrlich, MD  Relative Name and Phone Number:       Current Level of Care: Hospital Recommended Level of Care: Skilled Nursing Facility Prior Approval Number:    Date Approved/Denied:   PASRR Number: [id number])  Discharge Plan: SNF    Current Diagnoses: Patient Active Problem List   Diagnosis Date Noted  . Status post left knee replacement 05/15/2017  . Numbness 12/11/2015    Orientation RESPIRATION BLADDER Height & Weight     Self, Time, Situation, Place  Normal Continent Weight: 190 lb (86.2 kg) Height:  5\' 7"  (170.2 cm)  BEHAVIORAL SYMPTOMS/MOOD NEUROLOGICAL BOWEL NUTRITION STATUS      Continent Diet(Diet: Regular )  AMBULATORY STATUS COMMUNICATION OF NEEDS Skin   Extensive Assist Verbally Surgical wounds(Incision: Left Knee. )                       Personal Care Assistance Level of Assistance  Bathing, Feeding, Dressing Bathing Assistance: Limited assistance Feeding assistance: Independent Dressing Assistance: Limited assistance     Functional Limitations Info  Sight, Hearing, Speech Sight Info: Adequate Hearing Info: Adequate Speech Info: Adequate    SPECIAL CARE FACTORS FREQUENCY  PT (By licensed PT), OT (By licensed OT)     PT Frequency: (5) OT Frequency: (5)            Contractures      Additional Factors Info  Code Status, Allergies Code Status Info: (Full Code. ) Allergies Info: (No Known Allergies. )           Current Medications (05/15/2017):  This is the current hospital active medication list Current  Facility-Administered Medications  Medication Dose Route Frequency Provider Last Rate Last Dose  . [START ON 05/16/2017] acetaminophen (TYLENOL) tablet 325-650 mg  325-650 mg Oral Q6H PRN Lyndle Herrlich, MD      . acetaminophen (TYLENOL) tablet 500 mg  500 mg Oral Q6H Lyndle Herrlich, MD   500 mg at 05/15/17 1253  . bisacodyl (DULCOLAX) suppository 10 mg  10 mg Rectal Daily PRN Lyndle Herrlich, MD      . bupivacaine (MARCAINE) 0.5 % injection           . bupivacaine (MARCAINE) 0.5 % injection           . ceFAZolin (ANCEF) IVPB 2g/100 mL premix  2 g Intravenous Q6H Lyndle Herrlich, MD 200 mL/hr at 05/15/17 1424 2 g at 05/15/17 1424  . docusate sodium (COLACE) capsule 100 mg  100 mg Oral BID Lyndle Herrlich, MD   100 mg at 05/15/17 1254  . gabapentin (NEURONTIN) capsule 300 mg  300 mg Oral TID Lyndle Herrlich, MD   300 mg at 05/15/17 1253  . [START ON 05/16/2017] hydrochlorothiazide (HYDRODIURIL) tablet 25 mg  25 mg Oral Daily Lyndle Herrlich, MD      . HYDROcodone-acetaminophen [facility]) 7.5-325 MG per tablet 1-2 tablet  1-2 tablet Oral Q4H PRN Lyndle Herrlich, MD      . HYDROcodone-acetaminophen (NORCO/VICODIN) 5-325 MG per tablet 1-2 tablet  1-2 tablet Oral Q4H  PRN Lyndle HerrlichBowers, James R, MD      . ketorolac (TORADOL) 15 MG/ML injection 7.5 mg  7.5 mg Intravenous Q6H Lyndle HerrlichBowers, James R, MD      . lactated ringers infusion   Intravenous Continuous Lyndle HerrlichBowers, James R, MD 75 mL/hr at 05/15/17 1156    . magnesium citrate solution 1 Bottle  1 Bottle Oral Once PRN Lyndle HerrlichBowers, James R, MD      . menthol-cetylpyridinium (CEPACOL) lozenge 3 mg  1 lozenge Oral PRN Lyndle HerrlichBowers, James R, MD       Or  . phenol (CHLORASEPTIC) mouth spray 1 spray  1 spray Mouth/Throat PRN Lyndle HerrlichBowers, James R, MD      . metoCLOPramide (REGLAN) tablet 5-10 mg  5-10 mg Oral Q8H PRN Lyndle HerrlichBowers, James R, MD       Or  . metoCLOPramide (REGLAN) injection 5-10 mg  5-10 mg Intravenous Q8H PRN Lyndle HerrlichBowers, James R, MD      . morphine 2 MG/ML injection 0.5-1 mg  0.5-1 mg  Intravenous Q2H PRN Lyndle HerrlichBowers, James R, MD      . nitroGLYCERIN (NITROSTAT) SL tablet 0.4 mg  0.4 mg Sublingual Q5 min PRN Lyndle HerrlichBowers, James R, MD      . ondansetron [facility](ZOFRAN) tablet 4 mg  4 mg Oral Q6H PRN Lyndle HerrlichBowers, James R, MD       Or  . ondansetron [facility](ZOFRAN) injection 4 mg  4 mg Intravenous Q6H PRN Lyndle HerrlichBowers, James R, MD      . Melene Muller[START ON 05/16/2017] potassium chloride SA (K-DUR,KLOR-CON) CR tablet 20 mEq  20 mEq Oral BID Lyndle HerrlichBowers, James R, MD      . Melene Muller[START ON 05/16/2017] rivaroxaban (XARELTO) tablet 20 mg  20 mg Oral Daily Lyndle HerrlichBowers, James R, MD      . tamsulosin [facility](FLOMAX) capsule 0.4 mg  0.4 mg Oral Daily Lyndle HerrlichBowers, James R, MD   0.4 mg at 05/15/17 1253  . traMADol (ULTRAM) tablet 50 mg  50 mg Oral Q6H Lyndle HerrlichBowers, James R, MD   50 mg at 05/15/17 1253     Discharge Medications: Please see discharge summary for a list of discharge medications.  Relevant Imaging Results:  Relevant Lab Results:   Additional Information (SSN: [id number])  Zali Kamaka, Darleen CrockerBailey M, LCSW

## 2017-05-15 NOTE — Anesthesia Post-op Follow-up Note (Signed)
Anesthesia QCDR form completed.        

## 2017-05-15 NOTE — Anesthesia Post-op Follow-up Note (Deleted)
Anesthesia QCDR form completed.        

## 2017-05-15 NOTE — Anesthesia Procedure Notes (Signed)
Spinal  Patient location during procedure: OR Start time: 05/15/2017 7:30 AM End time: 05/15/2017 7:35 AM Staffing Resident/CRNA: Oliva Bustardorbett, Lorean Ekstrand E, CRNA Performed: resident/CRNA  Preanesthetic Checklist Completed: patient identified, site marked, surgical consent, pre-op evaluation, timeout performed, IV checked, risks and benefits discussed and monitors and equipment checked Spinal Block Patient position: sitting Prep: ChloraPrep Patient monitoring: continuous pulse ox and blood pressure Approach: midline Location: L3-4 Injection technique: single-shot Needle Needle type: Pencil-Tip  Needle gauge: 24 G Needle length: 5 cm Needle insertion depth: 4 cm Assessment Sensory level: T8 Additional Notes Attempt x 1 @ L4-5; Spinal placed with ease at L3-4. Negative heme. T8 level assessed prior to sedation begin.

## 2017-05-15 NOTE — Anesthesia Preprocedure Evaluation (Addendum)
Anesthesia Evaluation  Patient identified by MRN, date of birth, ID band Patient awake    Reviewed: Allergy & Precautions, H&P , NPO status , Patient's Chart, lab work & pertinent test results, reviewed documented beta blocker date and time   Airway Mallampati: II   Neck ROM: full    Dental  (+) Poor Dentition   Pulmonary neg pulmonary ROS, shortness of breath and with exertion,    Pulmonary exam normal        Cardiovascular Exercise Tolerance: Good hypertension, On Medications negative cardio ROS Normal cardiovascular exam+ dysrhythmias  Rhythm:regular Rate:Normal     Neuro/Psych CVA, No Residual Symptoms negative neurological ROS  negative psych ROS   GI/Hepatic negative GI ROS, Neg liver ROS,   Endo/Other  negative endocrine ROS  Renal/GU negative Renal ROS  negative genitourinary   Musculoskeletal   Abdominal   Peds  Hematology negative hematology ROS (+)   Anesthesia Other Findings Past Medical History: No date: Arthritis No date: Atrial fibrillation (HCC) No date: Dyspnea     Comment:  WORKUP NO CAUSE FOUND No date: Dysrhythmia     Comment:  Afib No date: Hypertension No date: Stroke [facility](HCC)     Comment:  POSSIBLE Past Surgical History: No date: CARPAL TUNNEL RELEASE 05/03/2016: CATARACT EXTRACTION W/PHACO; Right     Comment:  Procedure: CATARACT EXTRACTION PHACO AND INTRAOCULAR               LENS PLACEMENT (IOC);  Surgeon: Galen ManilaWilliam Porfilio, MD;                Location: [facility] ORS;  Service: Ophthalmology;  Laterality:              Right;  US 00:35 AP% 14.2 CDE 5.04 fluid pack lot #               [id number] H 2000: HERNIA REPAIR     Comment:  inguinal BMI    Body Mass Index:  29.76 kg/m     Reproductive/Obstetrics negative OB ROS                             Anesthesia Physical Anesthesia Plan  ASA: III  Anesthesia Plan: General and Spinal   Post-op Pain Management:   Regional for Post-op pain   Induction:   PONV Risk Score and Plan:   Airway Management Planned:   Additional Equipment:   Intra-op Plan:   Post-operative Plan:   Informed Consent: I have reviewed the patients History and Physical, chart, labs and discussed the procedure including the risks, benefits and alternatives for the proposed anesthesia with the patient or authorized representative who has indicated his/her understanding and acceptance.   Dental Advisory Given  Plan Discussed with: CRNA  Anesthesia Plan Comments:        Anesthesia Quick Evaluation

## 2017-05-15 NOTE — H&P (Signed)
The patient has been re-examined, and the chart reviewed, and there have been no interval changes to the documented history and physical.  Plan a left knee replacement today.  Anesthesia will be consulted regarding a peripheral nerve block for post-operative pain.  The risks, benefits, and alternatives have been discussed at length, and the patient is willing to proceed.

## 2017-05-16 LAB — CBC
HCT: 34.2 % — ABNORMAL LOW (ref 40.0–52.0)
HEMOGLOBIN: 11.8 g/dL — AB (ref 13.0–18.0)
MCH: 33 pg (ref 26.0–34.0)
MCHC: 34.5 g/dL (ref 32.0–36.0)
MCV: 95.8 fL (ref 80.0–100.0)
Platelets: 192 10*3/uL (ref 150–440)
RBC: 3.57 MIL/uL — AB (ref 4.40–5.90)
RDW: 13.7 % (ref 11.5–14.5)
WBC: 9.5 10*3/uL (ref 3.8–10.6)

## 2017-05-16 LAB — BASIC METABOLIC PANEL
ANION GAP: 6 (ref 5–15)
BUN: 21 mg/dL — ABNORMAL HIGH (ref 6–20)
CHLORIDE: 107 mmol/L (ref 101–111)
CO2: 26 mmol/L (ref 22–32)
CREATININE: 1.5 mg/dL — AB (ref 0.61–1.24)
Calcium: 8.3 mg/dL — ABNORMAL LOW (ref 8.9–10.3)
GFR calc non Af Amer: 45 mL/min — ABNORMAL LOW (ref >60)
GFR, EST AFRICAN AMERICAN: 52 mL/min — AB (ref >60)
Glucose, Bld: 125 mg/dL — ABNORMAL HIGH (ref 65–99)
POTASSIUM: 4.8 mmol/L (ref 3.5–5.1)
Sodium: 139 mmol/L (ref 135–145)

## 2017-05-16 NOTE — Progress Notes (Signed)
Pt alert and oriented. Ambulated to bathroom with standby assist. Dressing dry and intact to left knee. Denies pain. Vital signs stable. No acute distress noted. Staff will continue to monitor pt

## 2017-05-16 NOTE — Progress Notes (Signed)
Physical Therapy Treatment Patient Details Name: Thomas KillingsJohn G Newman MRN: [id number] DOB: May 23, 1945 Today's Date: 05/16/2017    History of Present Illness Pt is a 72 yo M diagnosed with OA of the L knee and is s/p elective L TKA.  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    PT Comments    Pt presents with minor deficits in strength, transfers, mobility, L knee ROM, gait, balance, and activity tolerance but overall is progressing very well towards goals.  Pt continues to require no physical assistance with any functional tasks and was steady with amb over a distance of 150' with a RW.  Pt reported L knee pain increased to a max of 4/10 during amb but quickly subsided back to a 2/10 upon sitting.  Plan is to progress pt to stair training next session.  Pt will benefit from HHPT services upon discharge to safely address above deficits for decreased caregiver assistance and eventual return to PLOF.     Follow Up Recommendations  Home health PT     Equipment Recommendations  None recommended by PT    Recommendations for Other Services       Precautions / Restrictions Precautions Precautions: Knee;Fall Restrictions Weight Bearing Restrictions: Yes LLE Weight Bearing: Weight bearing as tolerated    Mobility  Bed Mobility Overal bed mobility: Modified Independent             General bed mobility comments: Extra time and effort and use of bed rail during bed mobility tasks but no physical assistance required  Transfers Overall transfer level: Needs assistance Equipment used: Rolling walker (2 wheeled) Transfers: Sit to/from Stand Sit to Stand: Supervision         General transfer comment: Good eccentric and concentric control with mod verbal and visual cues for sequencing   Ambulation/Gait Ambulation/Gait assistance: Supervision Ambulation Distance (Feet): 150 Feet Assistive device: Rolling walker (2  wheeled) Gait Pattern/deviations: Step-through pattern;Decreased step length - right;Decreased stance time - left Gait velocity: Decreased   General Gait Details: Pt steady during amb with improving cadence and only mildly antalgic on the LLE   Stairs             Wheelchair Mobility    Modified Rankin (Stroke Patients Only)       Balance Overall balance assessment: Needs assistance   Sitting balance-Leahy Scale: Good     Standing balance support: Bilateral upper extremity supported Standing balance-Leahy Scale: Good                              Cognition Arousal/Alertness: Awake/alert Behavior During Therapy: WFL for tasks assessed/performed Overall Cognitive Status: Within Functional Limits for tasks assessed                                        Exercises Total Joint Exercises Ankle Circles/Pumps: AROM;Both;10 reps Quad Sets: Strengthening;10 reps;Left;15 reps Gluteal Sets: Strengthening;Both;10 reps Hip ABduction/ADduction: AROM;Both;10 reps Straight Leg Raises: AROM;Both;10 reps Long Arc Quad: AROM;10 reps;15 reps;Left Knee Flexion: AROM;10 reps;15 reps;Left Goniometric ROM: L knee AROM: 0-98 deg Marching in Standing: AROM;Both;10 reps Other Exercises Other Exercises: Positioning education and review Other Exercises: HEP education and review per TKA handout provided with pt and spouse Other Exercises: Dynamic balance training with reaching outside of BOS without UE  support    General Comments        Pertinent Vitals/Pain Pain Assessment: 0-10 Pain Score: 2  Pain Location: No pain at rest, 2/10 L knee pain with ambulation Pain Descriptors / Indicators: Sore Pain Intervention(s): Premedicated before session;Monitored during session    Home Living                      Prior Function            PT Goals (current goals can now be found in the care plan section) Progress towards PT goals: Progressing toward  goals    Frequency    BID      PT Plan Current plan remains appropriate    Co-evaluation              AM-PAC PT "6 Clicks" Daily Activity  Outcome Measure                   End of Session Equipment Utilized During Treatment: Gait belt Activity Tolerance: Patient tolerated treatment well Patient left: in chair;with chair alarm set;with SCD's reapplied;with call bell/phone within reach;Other (comment)(polar care to L knee) Nurse Communication: Mobility status PT Visit Diagnosis: Other abnormalities of gait and mobility (R26.89);Muscle weakness (generalized) (M62.81)     Time: 1610-9604 PT Time Calculation (min) (ACUTE ONLY): 40 min  Charges:  $Gait Training: 8-22 mins $Therapeutic Exercise: 8-22 mins $Therapeutic Activity: 8-22 mins                    G Codes:       DElly Modena PT, DPT 05/16/17, 1:45 PM

## 2017-05-16 NOTE — Progress Notes (Signed)
MD Odis LusterBowers gave orders earlier in the day to insert foley if bladder scan was >350 ml. Pts bladder scan 742 ml. Rondel Oh(Dillian answered MD Odis LusterBowers phone) and was notified.Foley catheter order placed.

## 2017-05-16 NOTE — Progress Notes (Signed)
  Subjective:  Patient reports pain as mild.  Had difficulty urinating last night.  Objective:   VITALS:   Vitals:   05/15/17 1926 05/16/17 0001 05/16/17 0400 05/16/17 0735  BP: (!) 154/92 (!) 150/88 (!) 155/75 (!) 162/96  Pulse: 64 68 60 82  Resp: 18 18 19    Temp: 97.8 F (36.6 C) 98 F (36.7 C) 98.5 F (36.9 C) 98.8 F (37.1 C)  TempSrc: Oral Oral Oral Oral  SpO2: 97% 100% 100% 94%  Weight:      Height:        PHYSICAL EXAM:  ABD soft Sensation intact distally Intact pulses distally Dorsiflexion/Plantar flexion intact Incision: dressing C/D/I Compartment soft  LABS  Results for orders placed or performed during the hospital encounter of 05/15/17 (from the past 24 hour(s))  CBC     Status: Abnormal   Collection Time: 05/16/17  4:40 AM  Result Value Ref Range   WBC 9.5 3.8 - 10.6 K/uL   RBC 3.57 (L) 4.40 - 5.90 MIL/uL   Hemoglobin 11.8 (L) 13.0 - 18.0 g/dL   HCT 16.134.2 (L) 09.640.0 - 04.552.0 %   MCV 95.8 80.0 - 100.0 fL   MCH 33.0 26.0 - 34.0 pg   MCHC 34.5 32.0 - 36.0 g/dL   RDW 40.913.7 81.111.5 - 91.414.5 %   Platelets 192 150 - 440 K/uL  Basic metabolic panel     Status: Abnormal   Collection Time: 05/16/17  4:40 AM  Result Value Ref Range   Sodium 139 135 - 145 mmol/L   Potassium 4.8 3.5 - 5.1 mmol/L   Chloride 107 101 - 111 mmol/L   CO2 26 22 - 32 mmol/L   Glucose, Bld 125 (H) 65 - 99 mg/dL   BUN 21 (H) 6 - 20 mg/dL   Creatinine, Ser 7.821.50 (H) 0.61 - 1.24 mg/dL   Calcium 8.3 (L) 8.9 - 10.3 mg/dL   GFR calc non Af Amer 45 (L) >60 mL/min   GFR calc Af Amer 52 (L) >60 mL/min   Anion gap 6 5 - 15    Dg Knee Left Port  Result Date: 05/15/2017 CLINICAL DATA:  Left knee surgery EXAM: PORTABLE LEFT KNEE - 1-2 VIEW COMPARISON:  CT 02/14/2017 FINDINGS: Interval left knee arthroplasty, components projecting in expected location. No fracture or dislocation. Anterior skin staples. IMPRESSION: Left knee arthroplasty without apparent complication Electronically Signed   By: Corlis Leak   Hassell M.D.   On: 05/15/2017 10:19    Assessment/Plan: 1 Day Post-Op   Active Problems:   Status post left knee replacement   [facility] diet Up with therapy D/C IV fluids Plan for discharge tomorrow   Will perform bladder scan if his urine output does not increase   Lyndle HerrlichJames R Meagon Duskin , MD 05/16/2017, 10:24 AM

## 2017-05-16 NOTE — Progress Notes (Signed)
Clinical Social Worker (CSW) received SNF consult. PT is recommending home health. RN case manager aware of above. Please reconsult if future social work needs arise. CSW signing off.   Teanna Elem, LCSW [phone] 

## 2017-05-16 NOTE — Progress Notes (Signed)
Physical Therapy Treatment Patient Details Name: Thomas KillingsJohn G Rohrig MRN: [id number] DOB: 1946/01/21 Today's Date: 05/16/2017    History of Present Illness Pt is a 72 yo M diagnosed with OA of the L knee and is s/p elective L TKA.  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    PT Comments    Pt presents with mild deficits in strength, L knee ROM, transfers, mobility, gait, balance, and activity tolerance and continues to make excellent progress towards goals.  Pt increased amb distance to 200' this session and was steady ascending and descending 4 stairs with one rail.  Pt is compliant with doing his HEP in his room and has shown good carry over with positioning recommendations.  Pt ambulates with a slow but improving cadence and short B step length but is steady without LOB.  Pt will benefit from HHPT services upon discharge to safely address above deficits for decreased caregiver assistance and eventual return to PLOF.     Follow Up Recommendations  Home health PT     Equipment Recommendations  None recommended by PT    Recommendations for Other Services       Precautions / Restrictions Precautions Precautions: Knee;Fall Restrictions Weight Bearing Restrictions: Yes LLE Weight Bearing: Weight bearing as tolerated    Mobility  Bed Mobility Overal bed mobility: Modified Independent             General bed mobility comments: Extra time and effort and use of bed rail during bed mobility tasks but no physical assistance required  Transfers Overall transfer level: Needs assistance Equipment used: Rolling walker (2 wheeled) Transfers: Sit to/from Stand Sit to Stand: Supervision         General transfer comment: Good eccentric and concentric control with mod verbal and visual cues for sequencing   Ambulation/Gait Ambulation/Gait assistance: Supervision Ambulation Distance (Feet): 200 Feet Assistive  device: Rolling walker (2 wheeled) Gait Pattern/deviations: Step-through pattern;Decreased step length - right;Decreased stance time - left Gait velocity: Decreased   General Gait Details: Pt steady during amb with improving cadence with slightly more pain during amb than previous sessions at 4/10    Stairs Stairs: Yes Stairs assistance: Min guard Stair Management: One rail Left Number of Stairs: 4 General stair comments: Ascend/descend 2 steps with B rails and 4 steps with one rail with CGA and min verbal cues/demonstration for sequencing   Wheelchair Mobility    Modified Rankin (Stroke Patients Only)       Balance Overall balance assessment: Needs assistance   Sitting balance-Leahy Scale: Good     Standing balance support: Bilateral upper extremity supported Standing balance-Leahy Scale: Good                              Cognition Arousal/Alertness: Awake/alert Behavior During Therapy: WFL for tasks assessed/performed Overall Cognitive Status: Within Functional Limits for tasks assessed                                        Exercises Total Joint Exercises Ankle Circles/Pumps: AROM;Both;10 reps Quad Sets: Strengthening;10 reps;Left;15 reps Gluteal Sets: Strengthening;Both;10 reps Hip ABduction/ADduction: AROM;Both;10 reps Straight Leg Raises: AROM;Both;10 reps Long Arc Quad: AROM;10 reps;15 reps;Left Knee Flexion: AROM;10 reps;15 reps;Left Goniometric ROM: L knee AROM: 0-98 deg Marching in Standing: AROM;Both;10 reps Other  Exercises Other Exercises: 90 deg L turn training during amb to prevent CKC twisting on the L knee Other Exercises: HEP education and review per TKA handout provided with pt Other Exercises: Dynamic balance training with reaching outside of BOS without UE support    General Comments        Pertinent Vitals/Pain Pain Assessment: 0-10 Pain Score: 3  Pain Location: L knee Pain Descriptors / Indicators:  Sore Pain Intervention(s): Premedicated before session;Monitored during session    Home Living                      Prior Function            PT Goals (current goals can now be found in the care plan section) Progress towards PT goals: Progressing toward goals    Frequency    BID      PT Plan Current plan remains appropriate    Co-evaluation              AM-PAC PT "6 Clicks" Daily Activity  Outcome Measure                   End of Session Equipment Utilized During Treatment: Gait belt Activity Tolerance: Patient tolerated treatment well Patient left: with SCD's reapplied;with call bell/phone within reach;Other (comment);in bed;with bed alarm set(Polar care to L knee) Nurse Communication: Mobility status PT Visit Diagnosis: Other abnormalities of gait and mobility (R26.89);Muscle weakness (generalized) (M62.81)     Time: 1357-1440 PT Time Calculation (min) (ACUTE ONLY): 43 min  Charges:  $Gait Training: 23-37 mins $Therapeutic Exercise: 8-22 mins $Therapeutic Activity: 8-22 mins                    G Codes:       DElly Modena PT, DPT 05/16/17, 3:29 PM

## 2017-05-16 NOTE — Care Management Note (Signed)
Case Management Note  Patient Details  Name: Thomas Newman MRN: [id number] Date of Birth: [birth year]  Subjective/Objective:  POD # 1 left knee arthroplasty. Met with patient and his wife at bedside to discuss discharge planning. He has a walker and a cane. Bundle patient. PT recommending home health PT. Patient and his wife both feel he needs at least 1-2 weeks of home health PT. Wife has had bilateral knee and hip surgeries and she is not able to handle getting him in and out of the car at this time for outpatient PT. Offered choice of home health agencies and they prefer [facility]. Referral to [facility] with [facility].  They plan to go to [facility] in [place] following home health PT. Emailed Neoma Laming, Bundle Case manager with discharge details.                   Action/Plan: [facility] for HHPT.   Expected Discharge Date:                  Expected Discharge Plan:  [place]  In-House Referral:     Discharge planning Services  CM Consult  Post Acute Care Choice:  Home Health Choice offered to:  Patient, Spouse  DME Arranged:    DME Agency:     HH Arranged:  PT [place]:  [place]  Status of Service:  In process, will continue to follow  If discussed at Long Length of Stay Meetings, dates discussed:    Additional Comments:  Jolly Mango, RN 05/16/2017, 3:35 PM

## 2017-05-17 LAB — CBC
HCT: 32.4 % — ABNORMAL LOW (ref 40.0–52.0)
Hemoglobin: 11 g/dL — ABNORMAL LOW (ref 13.0–18.0)
MCH: 32.9 pg (ref 26.0–34.0)
MCHC: 34.1 g/dL (ref 32.0–36.0)
MCV: 96.4 fL (ref 80.0–100.0)
Platelets: 183 10*3/uL (ref 150–440)
RBC: 3.36 MIL/uL — AB (ref 4.40–5.90)
RDW: 13.8 % (ref 11.5–14.5)
WBC: 10 10*3/uL (ref 3.8–10.6)

## 2017-05-17 MED ORDER — DOCUSATE SODIUM 100 MG PO CAPS: 100.0000 mg | ORAL_CAPSULE | Freq: Two times a day (BID) | ORAL | 0 refills | Status: DC

## 2017-05-17 MED ORDER — RIVAROXABAN 20 MG PO TABS: 20.0000 mg | ORAL_TABLET | Freq: Every day | ORAL | 0 refills | Status: AC

## 2017-05-17 MED ORDER — HYDROCODONE-ACETAMINOPHEN 5-325 MG PO TABS: 1.0000 | ORAL_TABLET | ORAL | 0 refills | Status: DC | PRN

## 2017-05-17 NOTE — Anesthesia Postprocedure Evaluation (Signed)
Anesthesia Post Note  Patient: Thomas KillingsJohn G Newman  Procedure(s) Performed: TOTAL KNEE ARTHROPLASTY (Left Knee)  Patient location during evaluation: PACU Anesthesia Type: Spinal Level of consciousness: awake and alert Pain management: pain level controlled Vital Signs Assessment: post-procedure vital signs reviewed and stable Respiratory status: spontaneous breathing, nonlabored ventilation, respiratory function stable and patient connected to nasal cannula oxygen Cardiovascular status: blood pressure returned to baseline and stable Postop Assessment: no apparent nausea or vomiting Anesthetic complications: no     Last Vitals:  Vitals:   05/16/17 2059 05/16/17 2318  BP: (!) 164/94 (!) 156/87  Pulse: 81 67  Resp:  18  Temp:  37.1 C  SpO2:  100%    Last Pain:  Vitals:   05/16/17 2318  TempSrc: Oral  PainSc:                  Yevette EdwardsJames G Adams

## 2017-05-17 NOTE — Discharge Instructions (Signed)
Continue weight bear as tolerated on the left lower extremity.    Elevate the left lower extremity whenever possible and continue the polar care while elevating the extremity. Patient may shower. No bath or submerging the wound.    Take Rivaroxaban as directed for blood clot prevention.  Continue to work on knee range of motion exercises at home as instructed by physical therapy. Continue to use a walker for assistance with ambulation until cleared by physical therapy.  Call [phone] with any questions, such as fever > 101.5 degrees, drainage from the wound or shortness of breath.

## 2017-05-17 NOTE — Progress Notes (Signed)
Foley catheter removed at 8:45 this am. Patient has been encouraged to drink liquids. Patient verbalizes the urge to urinate but is unable to. Patient voided 50 ml with last attempt. A bladder scan showed 550 ml in. MD Odis LusterBowers made aware and per MD, ok to place foley cath again and discharge patient home with it. Pt will have home health services. And per MD, discontinue catheter on Monday. Patient and family aware and verbalized understanding.

## 2017-05-17 NOTE — Discharge Summary (Signed)
Physician Discharge Summary  Patient ID: Thomas Newman MRN: [id number] DOB/AGE: Jun 23, 1945 72 y.o.  Admit date: 05/15/2017 Discharge date: 05/17/2017  Admission Diagnoses:  M17.12 Unilateral primary osteoarthritis, left knee <principal problem not specified>  Discharge Diagnoses:  M17.12 Unilateral primary osteoarthritis, left knee Active Problems:   Status post left knee replacement   Past Medical History:  Diagnosis Date  . Arthritis   . Atrial fibrillation (HCC)   . Dyspnea    WORKUP NO CAUSE FOUND  . Dysrhythmia    Afib  . Hypertension   . Stroke [facility])    POSSIBLE    Surgeries: Procedure(s): TOTAL KNEE ARTHROPLASTY on 05/15/2017   Consultants (if any):   Discharged Condition: Improved  Hospital Course: Thomas Newman is an 72 y.o. male who was admitted 05/15/2017 with a diagnosis of  M17.12 Unilateral primary osteoarthritis, left knee <principal problem not specified> and went to the operating room on 05/15/2017 and underwent the above named procedures.    He was given perioperative antibiotics:  Anti-infectives (From admission, onward)   Start     Dose/Rate Route Frequency Ordered Stop   05/15/17 1145  ceFAZolin (ANCEF) IVPB 2g/100 mL premix     2 g 200 mL/hr over 30 Minutes Intravenous Every 6 hours 05/15/17 1143 05/16/17 0026   05/15/17 0833  50,000 units bacitracin in 0.9% normal saline 250 mL irrigation  Status:  Discontinued       As needed 05/15/17 0834 05/15/17 0940   05/15/17 0606  ceFAZolin (ANCEF) 2-3 GM-%(50ML) IVPB SOLR    Note to Pharmacy:  Carma Lair   : cabinet override      05/15/17 0606 05/15/17 0742   05/14/17 0515  ceFAZolin (ANCEF) IVPB 2g/100 mL premix     2 g 200 mL/hr over 30 Minutes Intravenous On call to O.R. 05/14/17 4098 05/15/17 0559    .  He was given sequential compression devices, early ambulation, and Rivaroxaban for DVT prophylaxis.  He benefited maximally from the hospital stay and there were no complications.  He did  have an episode of urinary retention for which a Foley catheter was placed. The catheter was removed prior to discharge.  Recent vital signs:  Vitals:   05/16/17 2318 05/17/17 0805  BP: (!) 156/87 (!) 114/58  Pulse: 67 81  Resp: 18 20  Temp: 98.8 F (37.1 C) 99.1 F (37.3 C)  SpO2: 100% 92%    Recent laboratory studies:  Lab Results  Component Value Date   HGB 11.0 (L) 05/17/2017   HGB 11.8 (L) 05/16/2017   HGB 13.3 05/15/2017   Lab Results  Component Value Date   WBC 10.0 05/17/2017   PLT 183 05/17/2017   Lab Results  Component Value Date   INR 2.25 05/03/2017   Lab Results  Component Value Date   NA 139 05/16/2017   K 4.8 05/16/2017   CL 107 05/16/2017   CO2 26 05/16/2017   BUN 21 (H) 05/16/2017   CREATININE 1.50 (H) 05/16/2017   GLUCOSE 125 (H) 05/16/2017    Discharge Medications:   Allergies as of 05/17/2017   No Known Allergies     Medication List    STOP taking these medications   acetaminophen 500 MG tablet Commonly known as:  TYLENOL   ibuprofen 200 MG tablet Commonly known as:  ADVIL,MOTRIN     TAKE these medications   docusate sodium 100 MG capsule Commonly known as:  COLACE Take 1 capsule (100 mg total) by mouth 2 (two) times  daily.   Fish Oil 1200 MG Caps Take 1,200 mg by mouth daily.   Glycopyrrolate-Formoterol 9-4.8 MCG/ACT Aero Commonly known as:  BEVESPI AEROSPHERE Inhale 2 puffs into the lungs 2 (two) times daily.   hydrochlorothiazide 25 MG tablet Commonly known as:  HYDRODIURIL Take 25 mg by mouth daily.   HYDROcodone-acetaminophen 5-325 MG tablet Commonly known as:  NORCO/VICODIN Take 1-2 tablets by mouth every 4 (four) hours as needed for moderate pain (pain score 4-6).   nitroGLYCERIN 0.3 MG SL tablet Commonly known as:  NITROSTAT Place 0.3 mg under the tongue every 5 (five) minutes as needed for chest pain.   oxymetazoline 0.05 % nasal spray Commonly known as:  AFRIN Place 1 spray into both nostrils daily as  needed for congestion.   potassium chloride SA 20 MEQ tablet Commonly known as:  K-DUR,KLOR-CON Take 20 mEq by mouth 2 (two) times daily.   rivaroxaban 20 MG Tabs tablet Commonly known as:  XARELTO Take 1 tablet (20 mg total) by mouth daily. What changed:  how much to take   SENIOR VITES Tbcr Take 1 tablet by mouth daily.   tamsulosin 0.4 MG Caps capsule Commonly known as:  FLOMAX Take 0.4 mg by mouth daily.       Diagnostic Studies: Dg Knee Left Port  Result Date: 05/15/2017 CLINICAL DATA:  Left knee surgery EXAM: PORTABLE LEFT KNEE - 1-2 VIEW COMPARISON:  CT 02/14/2017 FINDINGS: Interval left knee arthroplasty, components projecting in expected location. No fracture or dislocation. Anterior skin staples. IMPRESSION: Left knee arthroplasty without apparent complication Electronically Signed   By: Corlis Leak  Hassell M.D.   On: 05/15/2017 10:19    Disposition: Discharge disposition: 01-Home or Self Care       Discharge Instructions    Face-to-face encounter (required for Medicare/Medicaid patients)   Complete by:  As directed    I Lyndle HerrlichJames R Bowers certify that this patient is under my care and that I, or a nurse practitioner or physician's assistant working with me, had a face-to-face encounter that meets the physician face-to-face encounter requirements with this patient on 05/17/2017. The encounter with the patient was in whole, or in part for the following medical condition(s) which is the primary reason for home health care (List medical condition): total knee replacement   The encounter with the patient was in whole, or in part, for the following medical condition, which is the primary reason for home health care:  total knee replacement   I certify that, based on my findings, the following services are medically necessary home health services:  Physical therapy   Reason for Medically Necessary Home Health Services:  Therapy- Therapeutic Exercises to Increase Strength and Endurance    My clinical findings support the need for the above services:  Unable to leave home safely without assistance and/or assistive device   Further, I certify that my clinical findings support that this patient is homebound due to:  Unable to leave home safely without assistance   Home Health   Complete by:  As directed    To provide the following care/treatments:  PT         Signed: Lyndle HerrlichJames R Bowers ,MD 05/17/2017, 8:46 AM

## 2017-05-17 NOTE — Progress Notes (Signed)
Physical Therapy Treatment Patient Details Name: Thomas KillingsJohn G Poliquin MRN: [id number] DOB: Aug 01, 1945 Today's Date: 05/17/2017    History of Present Illness Pt is a 72 yo M diagnosed with OA of the L knee and is s/p elective L TKA.  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    PT Comments    Participated in exercises as described below.  Ambulated around unit and completed stair training with min guard.  Progressing well.  Voiced no further questions or concerns regarding therapy.  Awaiting discharge today after voiding.   Follow Up Recommendations  Home health PT     Equipment Recommendations  None recommended by PT    Recommendations for Other Services       Precautions / Restrictions Precautions Precautions: Knee;Fall Restrictions Weight Bearing Restrictions: Yes LLE Weight Bearing: Weight bearing as tolerated    Mobility  Bed Mobility Overal bed mobility: Modified Independent                Transfers Overall transfer level: Needs assistance Equipment used: Rolling walker (2 wheeled) Transfers: Sit to/from Stand Sit to Stand: Supervision         General transfer comment: Good eccentric and concentric control with mod verbal and visual cues for sequencing   Ambulation/Gait Ambulation/Gait assistance: Supervision;Min guard Ambulation Distance (Feet): 200 Feet Assistive device: Rolling walker (2 wheeled) Gait Pattern/deviations: Step-to pattern;Decreased step length - left;Decreased step length - right;Narrow base of support Gait velocity: Decreased   General Gait Details: slow stiff movements but overall does well without LOB   Stairs Stairs: Yes Stairs assistance: Min guard Stair Management: One rail Left Number of Stairs: 4     Wheelchair Mobility    Modified Rankin (Stroke Patients Only)       Balance Overall balance assessment: Needs assistance   Sitting balance-Leahy  Scale: Good     Standing balance support: Bilateral upper extremity supported Standing balance-Leahy Scale: Good                              Cognition Arousal/Alertness: Awake/alert Behavior During Therapy: WFL for tasks assessed/performed Overall Cognitive Status: Within Functional Limits for tasks assessed                                        Exercises Total Joint Exercises Ankle Circles/Pumps: AROM;Both;10 reps Quad Sets: Strengthening;10 reps;Left;15 reps Gluteal Sets: Strengthening;Both;10 reps Heel Slides: 10 reps;Strengthening;Left Hip ABduction/ADduction: 10 reps;Strengthening;Left Straight Leg Raises: 10 reps;Strengthening;Left Knee Flexion: 5 reps;Left;Strengthening Goniometric ROM: 0-104    General Comments        Pertinent Vitals/Pain Pain Assessment: 0-10 Pain Score: 2  Pain Location: L knee Pain Descriptors / Indicators: Sore Pain Intervention(s): Limited activity within patient's tolerance;Premedicated before session;Ice applied;Monitored during session    Home Living                      Prior Function            PT Goals (current goals can now be found in the care plan section) Progress towards PT goals: Progressing toward goals    Frequency    BID      PT Plan Current plan remains appropriate    Co-evaluation  AM-PAC PT "6 Clicks" Daily Activity  Outcome Measure  Difficulty turning over in bed (including adjusting bedclothes, sheets and blankets)?: None Difficulty moving from lying on back to sitting on the side of the bed? : None Difficulty sitting down on and standing up from a chair with arms (e.g., wheelchair, bedside commode, etc,.)?: A Little Help needed moving to and from a bed to chair (including a wheelchair)?: A Little Help needed walking in hospital room?: A Little Help needed climbing 3-5 steps with a railing? : A Little 6 Click Score: 20    End of Session  Equipment Utilized During Treatment: Gait belt Activity Tolerance: Patient tolerated treatment well Patient left: in chair;with chair alarm set;with call bell/phone within reach;with family/visitor present         Time: 1610-9604 PT Time Calculation (min) (ACUTE ONLY): 30 min  Charges:  $Gait Training: 8-22 mins $Therapeutic Exercise: 8-22 mins                    G Codes:       Danielle Dess, PTA 05/17/17, 10:45 AM

## 2017-05-17 NOTE — Progress Notes (Signed)
Discharge instructions and med details reviewed with patient and family. All questions answered. Patient verbalizes understanding. Printed AVS given to patient. Patient is aware of prescriptions sent to [facility] including pain medicine. Foley in place. All patient belongings packed including polar care. Patient's IV removed. Patient escorted out via wheelchair.

## 2017-05-17 NOTE — Care Management Note (Signed)
Case Management Note  Patient Details  Name: Thomas Newman MRN: [id number] Date of Birth: 04-06-1945  Subjective/Objective: Discharging today                   Action/Plan: Advanced notified of discharge  Expected Discharge Date:  05/17/17               Expected Discharge Plan:  Home w Home Health Services  In-House Referral:     Discharge planning Services  CM Consult  Post Acute Care Choice:  Home Health Choice offered to:  Patient, Spouse  DME Arranged:    DME Agency:     HH Arranged:  PT HH Agency:  [facility]  Status of Service:  Completed, signed off  If discussed at Long Length of Stay Meetings, dates discussed:    Additional Comments:  Thomas MemosLisa M Yariana Hoaglund, RN 05/17/2017, 9:22 AM

## 2017-05-22 ENCOUNTER — Other Ambulatory Visit: Payer: Self-pay

## 2017-05-22 ENCOUNTER — Encounter: Payer: Self-pay | Admitting: Emergency Medicine

## 2017-05-22 ENCOUNTER — Emergency Department
Admission: EM | Admit: 2017-05-22 | Discharge: 2017-05-23 | Disposition: A | Discharge status: Home / Self Care | Payer: Medicare HMO | Attending: Emergency Medicine | Admitting: Emergency Medicine

## 2017-05-22 DIAGNOSIS — Z96652 Presence of left artificial knee joint: Secondary | ICD-10-CM | POA: Diagnosis not present

## 2017-05-22 DIAGNOSIS — I10 Essential (primary) hypertension: Secondary | ICD-10-CM | POA: Diagnosis not present

## 2017-05-22 DIAGNOSIS — Z79899 Other long term (current) drug therapy: Secondary | ICD-10-CM | POA: Insufficient documentation

## 2017-05-22 DIAGNOSIS — R339 Retention of urine, unspecified: Secondary | ICD-10-CM | POA: Insufficient documentation

## 2017-05-22 LAB — URINALYSIS, COMPLETE (UACMP) WITH MICROSCOPIC
BILIRUBIN URINE: NEGATIVE
Glucose, UA: NEGATIVE mg/dL
Hgb urine dipstick: NEGATIVE
Ketones, ur: NEGATIVE mg/dL
Leukocytes, UA: NEGATIVE
NITRITE: NEGATIVE
PH: 7 (ref 5.0–8.0)
Protein, ur: NEGATIVE mg/dL
SPECIFIC GRAVITY, URINE: 1.011 (ref 1.005–1.030)
SQUAMOUS EPITHELIAL / LPF: NONE SEEN (ref 0–5)

## 2017-05-22 NOTE — ED Triage Notes (Signed)
Pt presents to ED with urinary retention. Pt states he had knee replacement last Monday and had difficulty voiding so a foley catheter was placed. Pt was instructed to remove foley this morning and pt has been unable to void since then. of urine in catheter bag when removed around 0630 this morning.

## 2017-05-22 NOTE — ED Provider Notes (Signed)
[facility] Emergency Department Provider Note  ____________________________________________   First MD Initiated Contact with Patient 05/22/17 2127     (approximate)  I have reviewed the triage vital signs and the nursing notes.   HISTORY  Chief Complaint Urinary Retention   HPI Thomas Newman is a 72 y.o. male who self presents the emergency department with acute urinary retention.  He had a left knee replacement last Monday and postoperatively had difficulty urinating so he had a Foley catheter placed.  He subsequently removed it himself this morning under physician advisement and has been unable to urinate ever since.  He now has severe throbbing aching discomfort in his suprapubic region.  He does have a history of BPH for which he takes Flomax.  He has no fevers or chills.  No back pain.  Past Medical History:  Diagnosis Date  . Arthritis   . Atrial fibrillation (HCC)   . Dyspnea    WORKUP NO CAUSE FOUND  . Dysrhythmia    Afib  . Hypertension   . Stroke [facility])    POSSIBLE    Patient Active Problem List   Diagnosis Date Noted  . Status post left knee replacement 05/15/2017  . Numbness 12/11/2015    Past Surgical History:  Procedure Laterality Date  . CARPAL TUNNEL RELEASE    . CATARACT EXTRACTION W/PHACO Right 05/03/2016   Procedure: CATARACT EXTRACTION PHACO AND INTRAOCULAR LENS PLACEMENT (IOC);  Surgeon: Galen Manila, MD;  Location: [facility] ORS;  Service: Ophthalmology;  Laterality: Right;  [place] 00:35 AP% 14.2 CDE 5.04 fluid pack lot # [id number]  . HERNIA REPAIR  2000   inguinal  . TOTAL KNEE ARTHROPLASTY Left 05/15/2017   Procedure: TOTAL KNEE ARTHROPLASTY;  Surgeon: Lyndle Herrlich, MD;  Location: [facility] ORS;  Service: Orthopedics;  Laterality: Left;    Prior to Admission medications   Medication Sig Start Date End Date Taking? Authorizing Provider  docusate sodium (COLACE) 100 MG capsule Take 1 capsule (100 mg total) by mouth 2 (two)  times daily. 05/17/17   Lyndle Herrlich, MD  Glycopyrrolate-Formoterol (BEVESPI AEROSPHERE) 9-4.8 MCG/ACT AERO Inhale 2 puffs into the lungs 2 (two) times daily. Patient not taking: Reported on 04/26/2016 04/15/16   Erin Fulling, MD  hydrochlorothiazide (HYDRODIURIL) 25 MG tablet Take 25 mg by mouth daily.    [provider]  HYDROcodone-acetaminophen (NORCO/VICODIN) 5-325 MG tablet Take 1-2 tablets by mouth every 4 (four) hours as needed for moderate pain (pain score 4-6). 05/17/17   Lyndle Herrlich, MD  Multiple Vitamins-Minerals (SENIOR VITES) TBCR Take 1 tablet by mouth daily.    [provider]  nitroGLYCERIN (NITROSTAT) 0.3 MG SL tablet Place 0.3 mg under the tongue every 5 (five) minutes as needed for chest pain.    [provider]  Omega-3 Fatty Acids (FISH OIL) 1200 MG CAPS Take 1,200 mg by mouth daily.    [provider]  oxymetazoline (AFRIN) 0.05 % nasal spray Place 1 spray into both nostrils daily as needed for congestion.    [provider]  potassium chloride SA (K-DUR,KLOR-CON) 20 MEQ tablet Take 20 mEq by mouth 2 (two) times daily.    [provider]  rivaroxaban (XARELTO) 20 MG TABS tablet Take 1 tablet (20 mg total) by mouth daily. 05/17/17   Lyndle Herrlich, MD  tamsulosin (FLOMAX) 0.4 MG CAPS capsule Take 0.4 mg by mouth daily.    [provider]    Allergies Patient has no known allergies.  Family History  Problem Relation Age of Onset  . Atrial fibrillation Mother   . Heart attack Father   . Breast cancer Sister   . Breast cancer Sister     Social History Social History   Tobacco Use  . Smoking status: Never Smoker  . Smokeless tobacco: Never Used  Substance Use Topics  . Alcohol use: Yes    Alcohol/week: 1.2 oz    Types: 1 Glasses of wine, 1 Cans of beer per week    Comment: occ  . Drug use: No    Review of Systems Constitutional: No fever/chills Cardiovascular: Denies chest pain. Respiratory:  Denies shortness of breath. Gastrointestinal: Positive for abdominal pain.  No nausea, no vomiting.  No diarrhea.  No constipation. Genitourinary: Positive for urinary retention Musculoskeletal: Negative for back pain. Skin: Negative for rash. Neurological: Negative for headaches, focal weakness or numbness.   ____________________________________________   PHYSICAL EXAM:  VITAL SIGNS: ED Triage Vitals  Enc Vitals Group     BP      Pulse      Resp      Temp      Temp src      SpO2      Weight      Height      Head Circumference      Peak Flow      Pain Score      Pain Loc      Pain Edu?      Excl. in GC?     Constitutional: Alert and oriented x4 fidgeting and appears extremely uncomfortable holding his lower abdomen Cardiovascular: Tachycardic rate, regular rhythm. Grossly normal heart sounds.  Good peripheral circulation. Respiratory: Normal respiratory effort.  No retractions. Lungs CTAB and moving good air Gastrointestinal: No peritonitis but quite full and tenderness suprapubic region Musculoskeletal: No lower extremity edema   Neurologic:  Normal speech and language. No gross focal neurologic deficits are appreciated. Skin:  Skin is warm, dry and intact. No rash noted. Psychiatric: Highly anxious appearing    ____________________________________________   DIFFERENTIAL includes but not limited to  Prostatitis, acute urinary retention, urinary tract infection, pyelonephritis ____________________________________________   LABS (all labs ordered are listed, but only abnormal results are displayed)  Labs Reviewed  URINALYSIS, COMPLETE (UACMP) WITH MICROSCOPIC - Abnormal; Notable for the following components:      Result Value   Color, Urine YELLOW (*)    APPearance CLEAR (*)    Bacteria, UA RARE (*)    All other components within normal limits  URINE CULTURE    Lab work reviewed by me with no evidence of  infection __________________________________________  EKG   ____________________________________________  RADIOLOGY   ____________________________________________   PROCEDURES  Procedure(s) performed: no  Procedures  Critical Care performed: no  Observation: no ____________________________________________   INITIAL IMPRESSION / ASSESSMENT AND PLAN / ED COURSE  Pertinent labs & imaging results that were available during my care of the patient were reviewed by me and considered in my medical decision making (see chart for details).  The patient arrives quite uncomfortable appearing with acute urinary retention.  Foley catheter placed with near immediate resolution of the patient's symptoms.  I advised him to keep the Foley catheter in.  I will refer him to urology as an outpatient.  He verbalizes understanding agreement the plan.      ____________________________________________   FINAL CLINICAL IMPRESSION(S) / ED DIAGNOSES  Final diagnoses:  Urinary retention      NEW MEDICATIONS STARTED  DURING THIS VISIT:  Discharge Medication List as of 05/22/2017  9:36 PM       Note:  This document was prepared using Dragon voice recognition software and may include unintentional dictation errors.     Merrily Brittle, MD 05/23/17 2138

## 2017-05-22 NOTE — ED Notes (Signed)
Pt had foley cath removed this am after having a foley for 1 week after left knee replacement surgery.  Pt has not voided today other than dribbles per pt.  Foley cath inserted without diff.  Pt tolerated well.  ua to lab  Family with pt.

## 2017-05-22 NOTE — ED Notes (Signed)
ED Provider at bedside. 

## 2017-05-22 NOTE — Discharge Instructions (Signed)
Please keep your catheter in place for the next week until you can follow-up with urology.  Return to the emergency department sooner for any concerns.  It was a pleasure to take care of you today, and thank you for coming to our emergency department.  If you have any questions or concerns before leaving please ask the nurse to grab me and I'm more than happy to go through your aftercare instructions again.  If you were prescribed any opioid pain medication today such as Norco, Vicodin, Percocet, morphine, hydrocodone, or oxycodone please make sure you do not drive when you are taking this medication as it can alter your ability to drive safely.  If you have any concerns once you are home that you are not improving or are in fact getting worse before you can make it to your follow-up appointment, please do not hesitate to call 911 and come back for further evaluation.  Merrily Brittle, MD

## 2017-05-23 ENCOUNTER — Telehealth: Payer: Self-pay

## 2017-05-23 NOTE — ED Notes (Signed)
Pt discharged home with family

## 2017-05-23 NOTE — Telephone Encounter (Signed)
EMMI Follow-up: Called Mr. Casella as it was noted he had follow-up questions.  No answer, left VM with my phone number for him to return call. Will call again.

## 2017-05-24 LAB — URINE CULTURE: Culture: NO GROWTH

## 2017-05-24 NOTE — Telephone Encounter (Signed)
EMMI Follow-up: 2nd call.Marland KitchenMarland KitchenTalked with Mr. Baird and he said everything was going well post surgery.  I let him know there would be another automated call and if he had any concerns to just let us know. No needs noted today.

## 2017-05-29 ENCOUNTER — Telehealth: Payer: Self-pay | Admitting: Urology

## 2017-05-29 NOTE — Telephone Encounter (Signed)
Patient had knee surgery and experienced retention after surgery.  He is scheduled for a new patient appt on Friday 5/10 in [place] at 3:00pm.  Patient's wife called the office today.  Patient is expecting to have catheter removed at this appointment.  Patient's wife is a retired Char[employer] fundraiser and asked if she could remove the catheter on Friday morning and bring him to appt at 3:00pm.  Per Maralyn Sago, CMA, this will be okay.  Instructed the patient's wife to remove the catheter in the morning, advise him to drink plenty of fluids and come to appointment at 3:00pm in [place].

## 2017-06-02 ENCOUNTER — Encounter: Payer: Self-pay | Admitting: Urology

## 2017-06-02 ENCOUNTER — Ambulatory Visit (INDEPENDENT_AMBULATORY_CARE_PROVIDER_SITE_OTHER): Payer: Medicare HMO | Admitting: Urology

## 2017-06-02 VITALS — BP 119/66 | HR 89 | Ht 68.0 in | Wt 184.0 lb

## 2017-06-02 DIAGNOSIS — R339 Retention of urine, unspecified: Secondary | ICD-10-CM

## 2017-06-02 LAB — BLADDER SCAN AMB NON-IMAGING

## 2017-06-02 MED ORDER — FINASTERIDE 5 MG PO TABS: 5.0000 mg | ORAL_TABLET | Freq: Every day | ORAL | 11 refills | Status: DC

## 2017-06-02 NOTE — Progress Notes (Signed)
06/02/2017 3:22 PM   Marybelle Killings 02-02-1945 [id number]  Referring provider: Dortha Kern, MD [address] [place], [place] [address]  Chief Complaint  Patient presents with  . Urinary Retention    New Patient    HPI: 72 year old male who presents today for further evaluation of urinary retention.  He developed postoperative urinary retention following a left knee arthroplasty with Dr. Odis Luster on 05/15/2017.  He was discharged home with a Foley catheter.  He underwent a voiding trial on 05/22/2017 but was unable to void spontaneously and ultimately presented to the emergency room where his Foley catheter was replaced.  His wife is an Charity fundraiser and removed his catheter earlier this morning.  He has been able to void spontaneously 3 times today.  His PVR today is 175 cc.  Prior to surgery, he reports a slow urinary stream with intermittency.  He also  gets up 3-4 times a night to void.  He does have some daytime frequency without significant urgency.  No dysuria or gross hematuria.  No UTIs.  No previous history of urinary retention.  He is been having urinary issues for many years and is been taking Flomax.  He is never seen a urologist in the past.   He does note today that he is developed a your urticarial type of rash.  He thinks it may be his new laundry detergent.  He has not eaten any new foods and no new medications.  He has been taking Benadryl, most recently as last night but is worried that Benadryl will prevent him from voiding.  Most recent PSA 2.15 on 11/2013.  Not had a recent rectal exam.   PMH: Past Medical History:  Diagnosis Date  . Arthritis   . Atrial fibrillation (HCC)   . Dyspnea    WORKUP NO CAUSE FOUND  . Dysrhythmia    Afib  . Hypertension   . Stroke [facility])    POSSIBLE    Surgical History: Past Surgical History:  Procedure Laterality Date  . CARPAL TUNNEL RELEASE    . CATARACT EXTRACTION W/PHACO Right 05/03/2016   Procedure: CATARACT EXTRACTION PHACO  AND INTRAOCULAR LENS PLACEMENT (IOC);  Surgeon: Galen Manila, MD;  Location: [facility] ORS;  Service: Ophthalmology;  Laterality: Right;  [place] 00:35 AP% 14.2 CDE 5.04 fluid pack lot # [id number]  . HERNIA REPAIR  2000   inguinal  . TOTAL KNEE ARTHROPLASTY Left 05/15/2017   Procedure: TOTAL KNEE ARTHROPLASTY;  Surgeon: Lyndle Herrlich, MD;  Location: [facility] ORS;  Service: Orthopedics;  Laterality: Left;    Home Medications:  Allergies as of 06/02/2017   No Known Allergies     Medication List        Accurate as of 06/02/17  3:22 PM. Always use your most recent med list.          finasteride 5 MG tablet Commonly known as:  PROSCAR Take 1 tablet (5 mg total) by mouth daily.   Fish Oil 1200 MG Caps Take 1,200 mg by mouth daily.   Glycopyrrolate-Formoterol 9-4.8 MCG/ACT Aero Commonly known as:  BEVESPI AEROSPHERE Inhale 2 puffs into the lungs 2 (two) times daily.   hydrochlorothiazide 25 MG tablet Commonly known as:  HYDRODIURIL Take 25 mg by mouth daily.   nitroGLYCERIN 0.3 MG SL tablet Commonly known as:  NITROSTAT Place 0.3 mg under the tongue every 5 (five) minutes as needed for chest pain.   oxymetazoline 0.05 % nasal spray Commonly known as:  AFRIN Place 1 spray  into both nostrils daily as needed for congestion.   potassium chloride SA 20 MEQ tablet Commonly known as:  K-DUR,KLOR-CON Take 20 mEq by mouth 2 (two) times daily.   rivaroxaban 20 MG Tabs tablet Commonly known as:  XARELTO Take 1 tablet (20 mg total) by mouth daily.   SENIOR VITES Tbcr Take 1 tablet by mouth daily.   tamsulosin 0.4 MG Caps capsule Commonly known as:  FLOMAX Take 0.4 mg by mouth daily.       Allergies: No Known Allergies  Family History: Family History  Problem Relation Age of Onset  . Atrial fibrillation Mother   . Heart attack Father   . Breast cancer Sister   . Breast cancer Sister     Social History:  reports that he has never smoked. He has never used smokeless tobacco.  He reports that he drinks about 1.2 oz of alcohol per week. He reports that he does not use drugs.  ROS: UROLOGY Frequent Urination?: Yes Hard to postpone urination?: No Burning/pain with urination?: No Get up at night to urinate?: Yes Leakage of urine?: No Urine stream starts and stops?: Yes Trouble starting stream?: Yes Do you have to strain to urinate?: Yes Blood in urine?: No Urinary tract infection?: No Sexually transmitted disease?: No Injury to kidneys or bladder?: No Painful intercourse?: No Weak stream?: Yes Erection problems?: No Penile pain?: No  Gastrointestinal Nausea?: No Vomiting?: No Indigestion/heartburn?: No Diarrhea?: No Constipation?: No  Constitutional Fever: No Night sweats?: No Weight loss?: No Fatigue?: No  Skin Skin rash/lesions?: No Itching?: No  Eyes Blurred vision?: No Double vision?: No  Ears/Nose/Throat Sore throat?: No Sinus problems?: No  Hematologic/Lymphatic Swollen glands?: No Easy bruising?: No  Cardiovascular Leg swelling?: No Chest pain?: No  Respiratory Cough?: No Shortness of breath?: No  Endocrine Excessive thirst?: No  Musculoskeletal Back pain?: No Joint pain?: No  Neurological Headaches?: No Dizziness?: No  Psychologic Depression?: No Anxiety?: No  Physical Exam: BP 119/66   Pulse 89   Ht  (1.727 m)   Wt 184 lb (83.5 kg)   BMI 27.98 kg/m   Constitutional:  Alert and oriented, No acute distress.  Accompanied by wife today.  Ambulating with cane. HEENT: [place] AT, moist mucus membranes.  Trachea midline, no masses. Cardiovascular: No clubbing, cyanosis, or edema. Respiratory: Normal respiratory effort, no increased work of breathing. GI: Abdomen is soft, nontender, nondistended, no abdominal masses GU: Rectal exam was declined today, prefer to perform at next follow-up visit when more ambulatory Skin: Macular rash on left arm appreciated.  Left knee incision healing well. Neurologic:  Grossly intact, no focal deficits, moving all 4 extremities. Psychiatric: Normal mood and affect.  Laboratory Data: Lab Results  Component Value Date   WBC 10.0 05/17/2017   HGB 11.0 (L) 05/17/2017   HCT 32.4 (L) 05/17/2017   MCV 96.4 05/17/2017   PLT 183 05/17/2017    Lab Results  Component Value Date   CREATININE 1.50 (H) 05/16/2017    Lab Results  Component Value Date   HGBA1C 6.0 (H) 12/12/2015    Urinalysis    Component Value Date/Time   COLORURINE YELLOW (A) 05/22/2017 2213   APPEARANCEUR CLEAR (A) 05/22/2017 2213   LABSPEC 1.011 05/22/2017 2213   PHURINE 7.0 05/22/2017 2213   GLUCOSEU NEGATIVE 05/22/2017 2213   HGBUR NEGATIVE 05/22/2017 2213   BILIRUBINUR NEGATIVE 05/22/2017 2213   KETONESUR NEGATIVE 05/22/2017 2213   PROTEINUR NEGATIVE 05/22/2017 2213   NITRITE NEGATIVE 05/22/2017 2213  LEUKOCYTESUR NEGATIVE 05/22/2017 2213    Lab Results  Component Value Date   BACTERIA RARE (A) 05/22/2017    Pertinent Imaging: Results for orders placed or performed in visit on 06/02/17  BLADDER SCAN AMB NON-IMAGING  Result Value Ref Range   Scan Result      Assessment & Plan:    1. Urinary retention/ BPH with urinary obstruction Postoperative urinary retention likely multifactorial but suspect underlying BPH given history of obstructive urinary symptoms Voiding spontaneously today but with mildly elevated postvoid residual Patient/wife given samples of straight caths today and CIC teaching was performed as a precaution for over the weekend Continue Flomax, will start the patient on finasteride in addition to maximize medical therapy Discussed possible side effects of finasteride in detail Prefers to defer rectal exam until next follow-up visit with PSA We discussed  we discussed alternatives to pharmacotherapy including surgical intervention for underlying BPH which would require prostate sizing and cystoscopy to evaluate anatomy We will arrange for close  follow-up  - BLADDER SCAN AMB NON-IMAGING   Return in about 1 month (around 06/30/2017) for IPSS/ PVR/ UA/ PSA/ DRE.  Vanna Scotland, MD  [facility] [address], [address] [place], [place] [address] [phone]

## 2017-06-06 ENCOUNTER — Telehealth: Payer: Self-pay | Admitting: Urology

## 2017-06-06 NOTE — Telephone Encounter (Signed)
error 

## 2017-06-07 ENCOUNTER — Telehealth: Payer: Self-pay

## 2017-06-07 ENCOUNTER — Ambulatory Visit: Payer: Medicare HMO

## 2017-06-07 ENCOUNTER — Other Ambulatory Visit: Payer: Self-pay

## 2017-06-07 VITALS — BP 166/79 | HR 89 | Resp 16 | Ht 68.0 in | Wt 181.5 lb

## 2017-06-07 DIAGNOSIS — R339 Retention of urine, unspecified: Secondary | ICD-10-CM

## 2017-06-07 DIAGNOSIS — N400 Enlarged prostate without lower urinary tract symptoms: Secondary | ICD-10-CM

## 2017-06-07 LAB — URINALYSIS, COMPLETE
BILIRUBIN UA: NEGATIVE
GLUCOSE, UA: NEGATIVE
KETONES UA: NEGATIVE
Nitrite, UA: NEGATIVE
SPEC GRAV UA: 1.02 (ref 1.005–1.030)
Urobilinogen, Ur: 0.2 mg/dL (ref 0.2–1.0)
pH, UA: 5.5 (ref 5.0–7.5)

## 2017-06-07 LAB — MICROSCOPIC EXAMINATION: RBC, UA: NONE SEEN /hpf (ref 0–2)

## 2017-06-07 NOTE — Progress Notes (Signed)
Simple Catheter Placement  Due to urinary retention patient is present today for a foley cath placement.  Patient was cleaned and prepped in a sterile fashion with betadine and lidocaine jelly 2% was instilled into the urethra.  A 16 FR foley catheter was inserted, urine return was noted  , urine was yellow  in color.  The balloon was filled with 10cc of sterile water.  A leg bag was attached for drainage. Patient was also given a night bag to take home and was given instruction on how to change from one bag to another.  Patient was given instruction on proper catheter care.  Patient tolerated well, no complications were noted   Preformed by: Nydia Bouton, CMA  Additional notes/ Follow up: 1 week follow up for TOV and bladder scan  Blood pressure (!) 166/79, pulse 89, resp. rate 16, height  (1.727 m), weight 181 lb 8 oz (82.3 kg), SpO2 98 %.

## 2017-06-07 NOTE — Telephone Encounter (Signed)
Can you please change Mr. Reichs follow up on 07/07/17 to a TRUS and Cysto?

## 2017-06-08 ENCOUNTER — Telehealth: Payer: Self-pay | Admitting: Urology

## 2017-06-08 LAB — PSA: Prostate Specific Ag, Serum: 3.5 ng/mL (ref 0.0–4.0)

## 2017-06-08 NOTE — Telephone Encounter (Signed)
App has been changed and patient has been notified and I have gone over the Instructions with him.  Thomas Newman

## 2017-06-09 ENCOUNTER — Ambulatory Visit: Payer: Self-pay

## 2017-06-14 ENCOUNTER — Ambulatory Visit: Payer: Medicare HMO | Admitting: Family Medicine

## 2017-06-14 DIAGNOSIS — R339 Retention of urine, unspecified: Secondary | ICD-10-CM

## 2017-06-14 LAB — BLADDER SCAN AMB NON-IMAGING: SCAN RESULT: 60

## 2017-06-14 NOTE — Progress Notes (Signed)
Patient presents today for a PVR. The results was . He has an appointment scheduled for Cysto, he will keep that appointment at this time. I informed him to call if he has any urinary problems.

## 2017-07-07 ENCOUNTER — Encounter: Payer: Self-pay | Admitting: Urology

## 2017-07-07 ENCOUNTER — Ambulatory Visit: Payer: Medicare HMO | Admitting: Urology

## 2017-07-07 ENCOUNTER — Other Ambulatory Visit: Payer: Self-pay | Admitting: Radiology

## 2017-07-07 VITALS — BP 170/78 | HR 72 | Ht 68.0 in | Wt 182.6 lb

## 2017-07-07 DIAGNOSIS — Z87898 Personal history of other specified conditions: Secondary | ICD-10-CM

## 2017-07-07 DIAGNOSIS — N138 Other obstructive and reflux uropathy: Secondary | ICD-10-CM | POA: Diagnosis not present

## 2017-07-07 DIAGNOSIS — N401 Enlarged prostate with lower urinary tract symptoms: Principal | ICD-10-CM

## 2017-07-07 LAB — URINALYSIS, COMPLETE
Bilirubin, UA: NEGATIVE
Glucose, UA: NEGATIVE
Ketones, UA: NEGATIVE
LEUKOCYTES UA: NEGATIVE
Nitrite, UA: NEGATIVE
PH UA: 6 (ref 5.0–7.5)
Specific Gravity, UA: 1.02 (ref 1.005–1.030)
UUROB: 0.2 mg/dL (ref 0.2–1.0)

## 2017-07-07 LAB — MICROSCOPIC EXAMINATION
EPITHELIAL CELLS (NON RENAL): NONE SEEN /HPF (ref 0–10)
WBC UA: NONE SEEN /HPF (ref 0–5)

## 2017-07-07 MED ORDER — CIPROFLOXACIN HCL 500 MG PO TABS
500.0000 mg | ORAL_TABLET | Freq: Once | ORAL | Status: AC
  Administered 2017-07-07: 500 mg via ORAL

## 2017-07-07 MED ORDER — LIDOCAINE HCL URETHRAL/MUCOSAL 2 % EX GEL
1.0000 "application " | Freq: Once | CUTANEOUS | Status: AC
  Administered 2017-07-07: 1 via URETHRAL

## 2017-07-07 NOTE — Progress Notes (Signed)
07/07/17  Chief Complaint  Patient presents with  . Cysto     HPI: 72 year old male with refractory urinary symptoms related to BPH who presents today to the office for cystoscopy and prostate sizing.   Please see previous notes for details.     Blood pressure (!) 170/78, pulse 72, height 5\' 8"  (1.727 m), weight 182 lb 9.6 oz (82.8 kg). NED. A&Ox3.   No respiratory distress   Abd soft, NT, ND Normal phallus with bilateral descended testicles    Cystoscopy Procedure Note  Patient identification was confirmed, informed consent was obtained, and patient was prepped using Betadine solution.  Lidocaine jelly was administered per urethral meatus.    Preoperative abx where received prior to procedure.     Pre-Procedure: - Inspection reveals a normal caliber ureteral meatus.  Procedure: The flexible cystoscope was introduced without difficulty - No urethral strictures/lesions are present. - Enlarged prostate with trilobar coaptation - Elevated bladder neck - Bilateral ureteral orifices identified but somewhat difficult due to bladder neck elevation - Bladder mucosa  reveals no ulcers, tumors, or lesions, nonspecific cystitis appearance - No bladder stones -Moderate to severe trabeculation  Retroflexion shows significant circumferential intravesical protrusion of all 3 lobes   Post-Procedure: - Patient tolerated the procedure well   Prostate transrectal ultrasound sizing   Informed consent was obtained after discussing risks/benefits of the procedure.  A time out was performed to ensure correct patient identity.   Pre-Procedure: -Transrectal probe was placed without difficulty -Transrectal Ultrasound performed revealing a 57.15 gm prostate measuring 3.91 x 2.28 x 5.02 cm (length) -Intravesical extension into the base of the bladder appreciated   Assessment/ Plan:   1. Benign prostatic hyperplasia with urinary obstruction Improving with Flomax and finasteride, now  voiding spontaneously Cystoscopy today shows significant bladder trabeculation and intravesical protrusion We discussed that although he is doing well now, he does have evidence of sequela from chronic outlet obstruction We discussed out the procedure in terms of bladder preservation today and symptom control, possibly with the goal of stopping BPH meds He is interested in outlet procedure Patient is size and shape of his gland, he do well with a traditional TURP versus laser ablation.  He is not a candidate for UroLift. After considering all of his options, he is most interested in traditional TURP.  Risks of surgery were discussed today in detail including risk of bleeding, infection, damage surrounding structures, retrograde ejaculation, need for overnight observation, amongst others.  All questions were answered today.  \Heould like to proceed as planned.   - Urinalysis, Complete - ciprofloxacin (CIPRO) tablet 500 mg - lidocaine (XYLOCAINE) 2 % jelly 1 application - CULTURE, URINE COMPREHENSIVE  2. History of urinary retention As above   [name] Trapper Meech, MD

## 2017-07-07 NOTE — H&P (View-Only) (Signed)
07/07/17  Chief Complaint  Patient presents with  . Cysto     HPI: 71-year-old male with refractory urinary symptoms related to BPH who presents today to the office for cystoscopy and prostate sizing.   Please see previous notes for details.     Blood pressure (!) 170/78, pulse 72, height 5' 8" (1.727 m), weight 182 lb 9.6 oz (82.8 kg). NED. A&Ox3.   No respiratory distress   Abd soft, NT, ND Normal phallus with bilateral descended testicles    Cystoscopy Procedure Note  Patient identification was confirmed, informed consent was obtained, and patient was prepped using Betadine solution.  Lidocaine jelly was administered per urethral meatus.    Preoperative abx where received prior to procedure.     Pre-Procedure: - Inspection reveals a normal caliber ureteral meatus.  Procedure: The flexible cystoscope was introduced without difficulty - No urethral strictures/lesions are present. - Enlarged prostate with trilobar coaptation - Elevated bladder neck - Bilateral ureteral orifices identified but somewhat difficult due to bladder neck elevation - Bladder mucosa  reveals no ulcers, tumors, or lesions, nonspecific cystitis appearance - No bladder stones -Moderate to severe trabeculation  Retroflexion shows significant circumferential intravesical protrusion of all 3 lobes   Post-Procedure: - Patient tolerated the procedure well   Prostate transrectal ultrasound sizing   Informed consent was obtained after discussing risks/benefits of the procedure.  A time out was performed to ensure correct patient identity.   Pre-Procedure: -Transrectal probe was placed without difficulty -Transrectal Ultrasound performed revealing a 57.15 gm prostate measuring 3.91 x 2.28 x 5.02 cm (length) -Intravesical extension into the base of the bladder appreciated   Assessment/ Plan:   1. Benign prostatic hyperplasia with urinary obstruction Improving with Flomax and finasteride, now  voiding spontaneously Cystoscopy today shows significant bladder trabeculation and intravesical protrusion We discussed that although he is doing well now, he does have evidence of sequela from chronic outlet obstruction We discussed out the procedure in terms of bladder preservation today and symptom control, possibly with the goal of stopping BPH meds He is interested in outlet procedure Patient is size and shape of his gland, he do well with a traditional TURP versus laser ablation.  He is not a candidate for UroLift. After considering all of his options, he is most interested in traditional TURP.  Risks of surgery were discussed today in detail including risk of bleeding, infection, damage surrounding structures, retrograde ejaculation, need for overnight observation, amongst others.  All questions were answered today.  \Heould like to proceed as planned.   - Urinalysis, Complete - ciprofloxacin (CIPRO) tablet 500 mg - lidocaine (XYLOCAINE) 2 % jelly 1 application - CULTURE, URINE COMPREHENSIVE  2. History of urinary retention As above   Priyal Musquiz, MD     

## 2017-07-07 NOTE — Patient Instructions (Signed)
Transurethral Resection of the Prostate Transurethral resection of the prostate (TURP) is the removal (resection) of part of the gland that produces semen (prostate gland). This procedure is done to treat benign prostatic hyperplasia (BPH). BPH is an abnormal, noncancerous (benign) increase in the number of cells that make up the prostate tissue. BPH causes the prostate to get bigger. The enlarged prostate can push against or block the tube that drains urine from the bladder out of the body (urethra). BPH can affect normal urine flow by causing bladder infections, difficulty controlling bladder function, and difficulty emptying the bladder. The goal of TURP is to remove enough prostate tissue to allow for a normal flow of urine. In a transurethral resection, a thin telescope with a light, a tiny camera, and an electric cutting edge (resectoscope) is passed through the urethra and into the prostate. The opening of the urethra is at the end of the penis. Tell a health care provider about:  Any allergies you have.  All medicines you are taking, including vitamins, herbs, eye drops, creams, and over-the-counter medicines.  Any problems you or family members have had with anesthetic medicines.  Any blood disorders you have.  Any surgeries you have had.  Any medical conditions you have.  Any prostate infections you have had. What are the risks? Generally, this is a safe procedure. However, problems may occur, including:  Infection.  Bleeding.  Allergic reactions to medicines.  Damage to other structures or organs, such as: ? The urethra. ? The bladder. ? Muscles that surround the prostate.  Difficulty getting an erection.  Difficulty controlling urination (incontinence).  Scarring, which may cause problems with urine flow.  What happens before the procedure?  Follow instructions from your health care provider about eating or drinking restrictions.  Ask your health care provider  about: ? Changing or stopping your regular medicines. This is especially important if you are taking diabetes medicines or blood thinners. ? Taking medicines such as aspirin and ibuprofen. These medicines can thin your blood. Do not take these medicines before your procedure if your health care provider instructs you not to.  You may have a physical exam.  You may have a blood or urine sample taken.  You may be given antibiotic medicine to help prevent infection.  Ask your health care provider how your surgical site will be marked or identified.  Plan to have someone take you home after the procedure. You may not be able to drive for up to 10 days after your procedure. What happens during the procedure?  To reduce your risk of infection: ? Your health care team will wash or sanitize their hands. ? Your skin will be washed with soap.  An IV tube will be inserted into one of your veins.  You will be given one or more of the following: ? A medicine to help you relax (sedative). ? A medicine to make you fall asleep (general anesthetic). ? A medicine that is injected into your spine to numb the area below and slightly above the injection site (spinal anesthetic).  Your legs will be placed in foot rests (stirrups) so that your legs are apart and your knees are bent.  The resectoscope will be passed through your urethra to your prostate.  Parts of your prostate will be resected using the cutting edge of the resectoscope.  The resectoscope will be removed.  A thin, flexible tube (catheter) will be passed through your urethra and into your bladder. The catheter will drain   urine into a bag outside of your body. ? Fluid may be passed through the catheter to keep the catheter open. The procedure may vary among health care providers and hospitals. What happens after the procedure?  Your blood pressure, heart rate, breathing rate, and blood oxygen level will be monitored often until the  medicines you were given have worn off.  You may continue to receive fluids and medicines through an IV tube.  You may have some pain. Pain medicine will be available to help you.  You will have a catheter draining your urine. ? You may have blood in your urine. Your catheter may be kept in until your urine is clear. ? Your urinary drainage will be monitored. If necessary, your bladder may be rinsed out (irrigated) through your catheter.  You will be encouraged to walk around as soon as possible.  You may have to wear compression stockings. These stockings help prevent blood clots and reduce swelling in your legs.  Do not drive for 24 hours if you received a sedative. This information is not intended to replace advice given to you by your health care provider. Make sure you discuss any questions you have with your health care provider. Document Released: 01/10/2005 Document Revised: 09/13/2015 Document Reviewed: 10/02/2014 Elsevier Interactive Patient Education  2018 [employer]  

## 2017-07-10 LAB — CULTURE, URINE COMPREHENSIVE

## 2017-07-13 ENCOUNTER — Other Ambulatory Visit: Payer: Self-pay

## 2017-07-13 ENCOUNTER — Encounter
Admission: RE | Admit: 2017-07-13 | Discharge: 2017-07-13 | Disposition: A | Discharge status: Home / Self Care | Payer: Medicare HMO | Source: Ambulatory Visit | Attending: Urology | Admitting: Urology

## 2017-07-13 DIAGNOSIS — Z01812 Encounter for preprocedural laboratory examination: Secondary | ICD-10-CM | POA: Insufficient documentation

## 2017-07-13 LAB — BASIC METABOLIC PANEL
Anion gap: 9 (ref 5–15)
BUN: 20 mg/dL (ref 6–20)
CALCIUM: 8.9 mg/dL (ref 8.9–10.3)
CO2: 27 mmol/L (ref 22–32)
CREATININE: 1.32 mg/dL — AB (ref 0.61–1.24)
Chloride: 103 mmol/L (ref 101–111)
GFR calc Af Amer: >60 mL/min (ref >60)
GFR, EST NON AFRICAN AMERICAN: 53 mL/min — AB (ref >60)
GLUCOSE: 149 mg/dL — AB (ref 65–99)
POTASSIUM: 3.4 mmol/L — AB (ref 3.5–5.1)
Sodium: 139 mmol/L (ref 135–145)

## 2017-07-13 LAB — CBC
HEMATOCRIT: 37.2 % — AB (ref 40.0–52.0)
Hemoglobin: 12.9 g/dL — ABNORMAL LOW (ref 13.0–18.0)
MCH: 32.7 pg (ref 26.0–34.0)
MCHC: 34.6 g/dL (ref 32.0–36.0)
MCV: 94.3 fL (ref 80.0–100.0)
Platelets: 303 10*3/uL (ref 150–440)
RBC: 3.94 MIL/uL — ABNORMAL LOW (ref 4.40–5.90)
RDW: 13.9 % (ref 11.5–14.5)
WBC: 9.5 10*3/uL (ref 3.8–10.6)

## 2017-07-13 NOTE — Patient Instructions (Signed)
Your procedure is scheduled on: Mon. 6/24 Report to Day Surgery. To find out your arrival time please call [phone] between 1PM - 3PM on Frid.6/21  Remember: Instructions that are not followed completely may result in serious medical risk, up to and including death, or upon the discretion of your surgeon and anesthesiologist your surgery may need to be rescheduled.     _X__ 1. Do not eat food after midnight the night before your procedure.                 No gum chewing or hard candies. You may drink clear liquids up to 2 hours                 before you are scheduled to arrive for your surgery- DO not drink clear                 liquids within 2 hours of the start of your surgery.                 Clear Liquids include:  water, apple juice without pulp, clear carbohydrate                 drink such as Clearfast of Gartorade, Black Coffee or Tea (Do not add                 anything to coffee or tea).  __X__2.  On the morning of surgery brush your teeth with toothpaste and water, you  may rinse your mouth with mouthwash if you wish.  Do not swallow any              toothpaste of mouthwash.     _X__ 3.  No Alcohol for 24 hours before or after surgery.   ___ 4.  Do Not Smoke or use e-cigarettes For 24 Hours Prior to Your Surgery.                 Do not use any chewable tobacco products for at least 6 hours prior to                 surgery.  ____  5.  Bring all medications with you on the day of surgery if instructed.   _x___  6.  Notify your doctor if there is any change in your medical condition      (cold, fever, infections).     Do not wear jewelry, make-up, hairpins, clips or nail polish. Do not wear lotions, powders, or perfumes. You may wear deodorant. Do not shave 48 hours prior to surgery. Men may shave face and neck. Do not bring valuables to the hospital.    [facility] is not responsible for any belongings or valuables.  Contacts, dentures or  bridgework may not be worn into surgery. Leave your suitcase in the car. After surgery it may be brought to your room. For patients admitted to the hospital, discharge time is determined by your treatment team.   Patients discharged the day of surgery will not be allowed to drive home.   Please read over the following fact sheets that you were given:    _x__ Take these medicines the morning of surgery with A SIP OF WATER:    1. finasteride (PROSCAR) 5 MG tablet  2. oxymetazoline (AFRIN) 0.05 % nasal spray if needed  3. tamsulosin (FLOMAX) 0.4 MG CAPS capsule  4.  5.  6.  ____ Fleet Enema (as directed)   ____ Use  CHG Soap as directed  ____ Use inhalers on the day of surgery  ____ Stop metformin 2 days prior to surgery    ____ Take 1/2 of usual insulin dose the night before surgery. No insulin the morning          of surgery.   __x__ Stop rivaroxaban (XARELTO) 20 MG TABS tablet today  __x__ Stop Anti-inflammatories after today  Ibuprofen Aleve or aspirin    ____ Stop supplements until after surgery.    ____ Bring C-Pap to the hospital.

## 2017-07-16 MED ORDER — CEFAZOLIN SODIUM-DEXTROSE 2-4 GM/100ML-% IV SOLN
2.0000 g | INTRAVENOUS | Status: AC
  Administered 2017-07-17: 2 g via INTRAVENOUS

## 2017-07-17 ENCOUNTER — Ambulatory Visit: Payer: Medicare HMO | Admitting: Certified Registered"

## 2017-07-17 ENCOUNTER — Observation Stay
Admission: RE | Admit: 2017-07-17 | Discharge: 2017-07-18 | Disposition: A | Discharge status: Home / Self Care | Payer: Medicare HMO | Source: Ambulatory Visit | Attending: Urology | Admitting: Urology

## 2017-07-17 ENCOUNTER — Encounter
Admission: RE | Disposition: A | Discharge status: Home / Self Care | Payer: Self-pay | Source: Ambulatory Visit | Attending: Urology

## 2017-07-17 ENCOUNTER — Other Ambulatory Visit: Payer: Self-pay

## 2017-07-17 ENCOUNTER — Encounter: Payer: Self-pay | Admitting: *Deleted

## 2017-07-17 DIAGNOSIS — N138 Other obstructive and reflux uropathy: Secondary | ICD-10-CM | POA: Insufficient documentation

## 2017-07-17 DIAGNOSIS — Z8673 Personal history of transient ischemic attack (TIA), and cerebral infarction without residual deficits: Secondary | ICD-10-CM | POA: Insufficient documentation

## 2017-07-17 DIAGNOSIS — I4891 Unspecified atrial fibrillation: Secondary | ICD-10-CM | POA: Insufficient documentation

## 2017-07-17 DIAGNOSIS — Z79899 Other long term (current) drug therapy: Secondary | ICD-10-CM | POA: Insufficient documentation

## 2017-07-17 DIAGNOSIS — I10 Essential (primary) hypertension: Secondary | ICD-10-CM | POA: Diagnosis not present

## 2017-07-17 DIAGNOSIS — Z7901 Long term (current) use of anticoagulants: Secondary | ICD-10-CM | POA: Insufficient documentation

## 2017-07-17 DIAGNOSIS — N401 Enlarged prostate with lower urinary tract symptoms: Secondary | ICD-10-CM | POA: Diagnosis not present

## 2017-07-17 DIAGNOSIS — N320 Bladder-neck obstruction: Secondary | ICD-10-CM | POA: Diagnosis not present

## 2017-07-17 HISTORY — PX: TRANSURETHRAL RESECTION OF PROSTATE: SHX73

## 2017-07-17 SURGERY — TURP (TRANSURETHRAL RESECTION OF PROSTATE)
Anesthesia: General | Wound class: Clean Contaminated

## 2017-07-17 MED ORDER — OXYBUTYNIN CHLORIDE 5 MG PO TABS: 5.0000 mg | ORAL_TABLET | Freq: Three times a day (TID) | ORAL | Status: DC | PRN

## 2017-07-17 MED ORDER — FAMOTIDINE 20 MG PO TABS
ORAL_TABLET | ORAL | Status: AC
  Administered 2017-07-17: 20 mg via ORAL
  Filled 2017-07-17: qty 1

## 2017-07-17 MED ORDER — OXYCODONE HCL 5 MG PO TABS: 5.0000 mg | ORAL_TABLET | ORAL | Status: DC | PRN

## 2017-07-17 MED ORDER — MELATONIN 5 MG PO TABS
5.0000 mg | ORAL_TABLET | Freq: Every evening | ORAL | Status: DC | PRN
  Filled 2017-07-17: qty 1

## 2017-07-17 MED ORDER — DEXAMETHASONE SODIUM PHOSPHATE 10 MG/ML IJ SOLN
INTRAMUSCULAR | Status: DC | PRN
  Administered 2017-07-17: 5 mg via INTRAVENOUS

## 2017-07-17 MED ORDER — ONDANSETRON HCL 4 MG/2ML IJ SOLN
INTRAMUSCULAR | Status: DC | PRN
  Administered 2017-07-17: 4 mg via INTRAVENOUS

## 2017-07-17 MED ORDER — HEPARIN SODIUM (PORCINE) 5000 UNIT/ML IJ SOLN
5000.0000 [IU] | Freq: Three times a day (TID) | INTRAMUSCULAR | Status: DC
  Administered 2017-07-17 – 2017-07-18 (×3): 5000 [IU] via SUBCUTANEOUS
  Filled 2017-07-17 (×3): qty 1

## 2017-07-17 MED ORDER — FENTANYL CITRATE (PF) 100 MCG/2ML IJ SOLN
INTRAMUSCULAR | Status: AC
  Administered 2017-07-17: 25 ug via INTRAVENOUS
  Filled 2017-07-17: qty 2

## 2017-07-17 MED ORDER — LIDOCAINE HCL (PF) 2 % IJ SOLN
INTRAMUSCULAR | Status: AC
  Filled 2017-07-17: qty 10

## 2017-07-17 MED ORDER — DOCUSATE SODIUM 100 MG PO CAPS
100.0000 mg | ORAL_CAPSULE | Freq: Two times a day (BID) | ORAL | Status: DC
  Administered 2017-07-17 – 2017-07-18 (×2): 100 mg via ORAL
  Filled 2017-07-17 (×2): qty 1

## 2017-07-17 MED ORDER — FENTANYL CITRATE (PF) 100 MCG/2ML IJ SOLN
INTRAMUSCULAR | Status: AC
  Filled 2017-07-17: qty 2

## 2017-07-17 MED ORDER — ONDANSETRON HCL 4 MG/2ML IJ SOLN: 4.0000 mg | INTRAMUSCULAR | Status: DC | PRN

## 2017-07-17 MED ORDER — FENTANYL CITRATE (PF) 100 MCG/2ML IJ SOLN
25.0000 ug | INTRAMUSCULAR | Status: DC | PRN
  Administered 2017-07-17 (×3): 25 ug via INTRAVENOUS

## 2017-07-17 MED ORDER — DIPHENHYDRAMINE HCL 50 MG/ML IJ SOLN: 12.5000 mg | Freq: Four times a day (QID) | INTRAMUSCULAR | Status: DC | PRN

## 2017-07-17 MED ORDER — LACTATED RINGERS IV SOLN
INTRAVENOUS | Status: DC
  Administered 2017-07-17: 08:00:00 via INTRAVENOUS

## 2017-07-17 MED ORDER — OXYCODONE HCL 5 MG PO TABS: 5.0000 mg | ORAL_TABLET | Freq: Once | ORAL | Status: DC | PRN

## 2017-07-17 MED ORDER — LIDOCAINE HCL (CARDIAC) PF 100 MG/5ML IV SOSY
PREFILLED_SYRINGE | INTRAVENOUS | Status: DC | PRN
  Administered 2017-07-17: 80 mg via INTRAVENOUS

## 2017-07-17 MED ORDER — PROPOFOL 10 MG/ML IV BOLUS
INTRAVENOUS | Status: DC | PRN
  Administered 2017-07-17: 150 mg via INTRAVENOUS

## 2017-07-17 MED ORDER — SEVOFLURANE IN SOLN
RESPIRATORY_TRACT | Status: AC
  Filled 2017-07-17: qty 250

## 2017-07-17 MED ORDER — FENTANYL CITRATE (PF) 100 MCG/2ML IJ SOLN
INTRAMUSCULAR | Status: DC | PRN
  Administered 2017-07-17 (×3): 50 ug via INTRAVENOUS

## 2017-07-17 MED ORDER — SODIUM CHLORIDE 0.9 % IV SOLN
INTRAVENOUS | Status: DC
  Administered 2017-07-17 – 2017-07-18 (×2): via INTRAVENOUS

## 2017-07-17 MED ORDER — TAMSULOSIN HCL 0.4 MG PO CAPS
0.4000 mg | ORAL_CAPSULE | Freq: Every day | ORAL | Status: DC
  Administered 2017-07-18: 0.4 mg via ORAL
  Filled 2017-07-17: qty 1

## 2017-07-17 MED ORDER — PHENYLEPHRINE HCL 10 MG/ML IJ SOLN
INTRAMUSCULAR | Status: DC | PRN
  Administered 2017-07-17 (×3): 150 ug via INTRAVENOUS
  Administered 2017-07-17: 100 ug via INTRAVENOUS
  Administered 2017-07-17: 150 ug via INTRAVENOUS

## 2017-07-17 MED ORDER — SODIUM CHLORIDE 0.9 % IR SOLN
3000.0000 mL | Status: DC
  Administered 2017-07-17 – 2017-07-18 (×3): 3000 mL

## 2017-07-17 MED ORDER — CEFAZOLIN SODIUM-DEXTROSE 1-4 GM/50ML-% IV SOLN
1.0000 g | Freq: Three times a day (TID) | INTRAVENOUS | Status: AC
  Administered 2017-07-17 – 2017-07-18 (×2): 1 g via INTRAVENOUS
  Filled 2017-07-17 (×2): qty 50

## 2017-07-17 MED ORDER — CEFAZOLIN SODIUM-DEXTROSE 2-4 GM/100ML-% IV SOLN
INTRAVENOUS | Status: AC
  Filled 2017-07-17: qty 100

## 2017-07-17 MED ORDER — BELLADONNA ALKALOIDS-OPIUM 16.2-60 MG RE SUPP: 1.0000 | Freq: Four times a day (QID) | RECTAL | Status: DC | PRN

## 2017-07-17 MED ORDER — FINASTERIDE 5 MG PO TABS
5.0000 mg | ORAL_TABLET | Freq: Every day | ORAL | Status: DC
  Administered 2017-07-18: 5 mg via ORAL
  Filled 2017-07-17: qty 1

## 2017-07-17 MED ORDER — ONDANSETRON HCL 4 MG/2ML IJ SOLN
INTRAMUSCULAR | Status: AC
  Filled 2017-07-17: qty 2

## 2017-07-17 MED ORDER — OXYCODONE HCL 5 MG/5ML PO SOLN: 5.0000 mg | Freq: Once | ORAL | Status: DC | PRN

## 2017-07-17 MED ORDER — PROPOFOL 10 MG/ML IV BOLUS
INTRAVENOUS | Status: AC
  Filled 2017-07-17: qty 20

## 2017-07-17 MED ORDER — DEXAMETHASONE SODIUM PHOSPHATE 10 MG/ML IJ SOLN
INTRAMUSCULAR | Status: AC
Start: 2017-07-17 — End: ?
  Filled 2017-07-17: qty 1

## 2017-07-17 MED ORDER — MORPHINE SULFATE (PF) 2 MG/ML IV SOLN: 2.0000 mg | INTRAVENOUS | Status: DC | PRN

## 2017-07-17 MED ORDER — DIPHENHYDRAMINE HCL 12.5 MG/5ML PO ELIX
12.5000 mg | ORAL_SOLUTION | Freq: Four times a day (QID) | ORAL | Status: DC | PRN
  Filled 2017-07-17: qty 5

## 2017-07-17 MED ORDER — HYDROCHLOROTHIAZIDE 25 MG PO TABS
25.0000 mg | ORAL_TABLET | Freq: Every day | ORAL | Status: DC
  Administered 2017-07-17 – 2017-07-18 (×2): 25 mg via ORAL
  Filled 2017-07-17 (×2): qty 1

## 2017-07-17 MED ORDER — OXYMETAZOLINE HCL 0.05 % NA SOLN
1.0000 | Freq: Every day | NASAL | Status: DC | PRN
  Filled 2017-07-17: qty 15

## 2017-07-17 MED ORDER — FAMOTIDINE 20 MG PO TABS
20.0000 mg | ORAL_TABLET | Freq: Once | ORAL | Status: AC
  Administered 2017-07-17: 20 mg via ORAL

## 2017-07-17 SURGICAL SUPPLY — 23 items
ADAPTER IRRIG TUBE 2 SPIKE SOL (ADAPTER) ×6 IMPLANT
BAG DRAIN CYSTO-URO LG1000N (MISCELLANEOUS) ×3 IMPLANT
BAG URO DRAIN 4000ML (MISCELLANEOUS) ×3 IMPLANT
CATH FOL 2WAY LX 24X30 (CATHETERS) IMPLANT
CATH FOLEY 3WAY 30CC 22FR (CATHETERS) ×3 IMPLANT
DRAPE UTILITY 15X26 TOWEL STRL (DRAPES) ×3 IMPLANT
ELECT LOOP 22F BIPOLAR SML (ELECTROSURGICAL)
ELECTRODE LOOP 22F BIPOLAR SML (ELECTROSURGICAL) IMPLANT
GLOVE BIO SURGEON STRL SZ 6.5 (GLOVE) ×2 IMPLANT
GLOVE BIO SURGEONS STRL SZ 6.5 (GLOVE) ×1
GOWN STRL REUS W/ TWL LRG LVL3 (GOWN DISPOSABLE) ×2 IMPLANT
GOWN STRL REUS W/TWL LRG LVL3 (GOWN DISPOSABLE) ×4
HOLDER FOLEY CATH W/STRAP (MISCELLANEOUS) ×6 IMPLANT
KIT TURNOVER CYSTO (KITS) ×3 IMPLANT
LOOP CUT BIPOLAR 24F LRG (ELECTROSURGICAL) ×3 IMPLANT
PACK CYSTO AR (MISCELLANEOUS) ×3 IMPLANT
SET IRRIG Y TYPE TUR BLADDER L (SET/KITS/TRAYS/PACK) ×3 IMPLANT
SET IRRIGATING DISP (SET/KITS/TRAYS/PACK) ×3 IMPLANT
SOL .9 NS 3000ML IRR  AL (IV SOLUTION) ×16
SOL .9 NS 3000ML IRR UROMATIC (IV SOLUTION) ×8 IMPLANT
SYR TOOMEY 50ML (SYRINGE) ×3 IMPLANT
SYRINGE IRR TOOMEY STRL 70CC (SYRINGE) ×3 IMPLANT
WATER STERILE IRR 1000ML POUR (IV SOLUTION) ×3 IMPLANT

## 2017-07-17 NOTE — Op Note (Signed)
Transurethral Resection of the Prostate Procedure Note  Indications: The patient has bladder outlet obstruction secondary to BPH and has elected to proceed with TURP as definitive management.   Pre-operative Diagnosis: BPH  Post-operative Diagnosis: same  Surgeon: [name] Jeris Easterly   Anesthesia: General endotracheal anesthesia  Procedure Details  The patient was seen in the holding room. The risks, benefits, complications, treatment options, and expected outcomes were discussed with the patient. The possibilities of reaction to medication, pulmonary aspiration, perforation of viscus, bleeding, recurrent infection, the need for additional procedures, failure to diagnose a condition, and creating a complication requiring transfusion or operation were discussed with the patient. The patient concurred with the proposed plan, giving informed consent.  The site of surgery properly noted/marked. The patient was taken to operating room # 10, identified as Marybelle KillingsJohn G Maj and the procedure verified as Transurethral Resection of the Prostate. A Time Out was held and the above information confirmed.  After the induction of satisfactory anesthesia the patient was placed in the dorsal lithotomy position and prepped and draped in the usual sterile fashion. Lubricating jelly was injected into the urethral lumen which was then calibrated to 26 [place] using [employer] sounds. The 3826 French continuous flow resectoscope was introduced into the bladder under direct vision. The ureteral orifices were identified. The bippolar loop working element was used for the resection and NS was the irrigating fluid. The resection was begun with the median lobe tissue followed by the anterior and then lateral lobe tissue. The floor of the prostatic fossa and apical tissue were removed to complete the resection. Capsular fibers were identified and there were no capsular perforations. Hemostasis was achieved with coagulation current. The chips  were recovered from the bladder using the [facility] evacuator. The ureteral orifices were noted to be uninjured after the resection.. A 22 French 3 way Foley catheter was inserted at the conclusion of the procedure and continuous bladder irrigation initiated with clear returns.  Instrument, sponge, and needle counts were correct prior the abdominal closure and at the conclusion of the case.    Estimate Blood Loss:  Minimal         Drains: 22 Fr 3 way Foley         Specimens: Specimen: TURP chisp                 Complications:  None; patient tolerated the procedure well.         Disposition: PACU - hemodynamically stable.         Condition: stable  [name] Sobia Karger, MD

## 2017-07-17 NOTE — Interval H&P Note (Signed)
History and Physical Interval Note:  07/17/2017 9:39 AM  Thomas Newman  has presented today for surgery, with the diagnosis of BPH with obstruction  The various methods of treatment have been discussed with the patient and family. After consideration of risks, benefits and other options for treatment, the patient has consented to  Procedure(s): TRANSURETHRAL RESECTION OF THE PROSTATE (TURP) (N/A) as a surgical intervention .  The patient's history has been reviewed, patient examined, no change in status, stable for surgery.  I have reviewed the patient's chart and labs.  Questions were answered to the patient's satisfaction.    RRR CTAB   [name] Raneisha Bress

## 2017-07-17 NOTE — Anesthesia Postprocedure Evaluation (Signed)
Anesthesia Post Note  Patient: Thomas Newman  Procedure(s) Performed: TRANSURETHRAL RESECTION OF THE PROSTATE (TURP) (N/A )  Patient location during evaluation: PACU Anesthesia Type: General Level of consciousness: awake and alert Pain management: pain level controlled Vital Signs Assessment: post-procedure vital signs reviewed and stable Respiratory status: spontaneous breathing, nonlabored ventilation, respiratory function stable and patient connected to nasal cannula oxygen Cardiovascular status: blood pressure returned to baseline and stable Postop Assessment: no apparent nausea or vomiting Anesthetic complications: no     Last Vitals:  Vitals:   07/17/17 1355 07/17/17 1410  BP:  (!) 148/93  Pulse: 79 92  Resp:  20  Temp:    SpO2:  99%    Last Pain:  Vitals:   07/17/17 1410  TempSrc:   PainSc: 0-No pain                 Cleda MccreedyJoseph K Piscitello

## 2017-07-17 NOTE — Anesthesia Preprocedure Evaluation (Signed)
Anesthesia Evaluation  Patient identified by MRN, date of birth, ID band Patient awake    Reviewed: Allergy & Precautions, H&P , NPO status , Patient's Chart, lab work & pertinent test results  History of Anesthesia Complications Negative for: history of anesthetic complications  Airway Mallampati: III  TM Distance: <3 FB Neck ROM: limited    Dental  (+) Chipped, Poor Dentition, Caps   Pulmonary neg shortness of breath,           Cardiovascular Exercise Tolerance: Good hypertension, (-) angina(-) Past MI and (-) DOE + dysrhythmias Atrial Fibrillation      Neuro/Psych TIACVA negative psych ROS   GI/Hepatic negative GI ROS, Neg liver ROS, neg GERD  ,  Endo/Other  negative endocrine ROS  Renal/GU      Musculoskeletal  (+) Arthritis ,   Abdominal   Peds  Hematology negative hematology ROS (+)   Anesthesia Other Findings Past Medical History: No date: Arthritis No date: Atrial fibrillation (HCC) No date: Dyspnea     Comment:  WORKUP NO CAUSE FOUND No date: Dysrhythmia     Comment:  Afib No date: Hypertension No date: Stroke [facility](HCC)     Comment:  POSSIBLE No residual effects  Past Surgical History: No date: CARPAL TUNNEL RELEASE 05/03/2016: CATARACT EXTRACTION W/PHACO; Right     Comment:  Procedure: CATARACT EXTRACTION PHACO AND INTRAOCULAR               LENS PLACEMENT (IOC);  Surgeon: Galen ManilaWilliam Porfilio, MD;                Location: [facility] ORS;  Service: Ophthalmology;  Laterality:              Right;  US 00:35 AP% 14.2 CDE 5.04 fluid pack lot #               [id number] H 2000: HERNIA REPAIR; Left     Comment:  inguinal 05/15/2017: TOTAL KNEE ARTHROPLASTY; Left     Comment:  Procedure: TOTAL KNEE ARTHROPLASTY;  Surgeon: Lyndle HerrlichBowers,               James R, MD;  Location: [facility] ORS;  Service: Orthopedics;               Laterality: Left;  BMI    Body Mass Index:  27.67 kg/m      Reproductive/Obstetrics negative OB  ROS                             Anesthesia Physical Anesthesia Plan  ASA: III  Anesthesia Plan: General LMA   Post-op Pain Management:    Induction: Intravenous  PONV Risk Score and Plan: Ondansetron, Dexamethasone, Midazolam and Treatment may vary due to age or medical condition  Airway Management Planned: LMA  Additional Equipment:   Intra-op Plan:   Post-operative Plan: Extubation in OR  Informed Consent: I have reviewed the patients History and Physical, chart, labs and discussed the procedure including the risks, benefits and alternatives for the proposed anesthesia with the patient or authorized representative who has indicated his/her understanding and acceptance.   Dental Advisory Given  Plan Discussed with: Anesthesiologist, CRNA and Surgeon  Anesthesia Plan Comments: (Patient consented for risks of anesthesia including but not limited to:  - adverse reactions to medications - damage to teeth, lips or other oral mucosa - sore throat or hoarseness - Damage to heart, brain, lungs or loss of life  Patient voiced understanding.)  Anesthesia Quick Evaluation  

## 2017-07-17 NOTE — Transfer of Care (Signed)
Immediate Anesthesia Transfer of Care Note  Patient: Marybelle KillingsJohn G Dupler  Procedure(s) Performed: TRANSURETHRAL RESECTION OF THE PROSTATE (TURP) (N/A )  Patient Location: PACU  Anesthesia Type:General  Level of Consciousness: awake  Airway & Oxygen Therapy: Patient Spontanous Breathing  Post-op Assessment: Report given to RN and Post -op Vital signs reviewed and stable  Post vital signs: Reviewed and stable  Last Vitals:  Vitals Value Taken Time  BP    Temp    Pulse 79 07/17/2017 11:38 AM  Resp 10 07/17/2017 11:38 AM  SpO2 96 % 07/17/2017 11:38 AM  Vitals shown include unvalidated device data.  Last Pain:  Vitals:   07/17/17 0805  TempSrc: Tympanic  PainSc: 0-No pain         Complications: No apparent anesthesia complications

## 2017-07-17 NOTE — Anesthesia Post-op Follow-up Note (Signed)
Anesthesia QCDR form completed.        

## 2017-07-18 ENCOUNTER — Encounter: Payer: Self-pay | Admitting: Urology

## 2017-07-18 DIAGNOSIS — N401 Enlarged prostate with lower urinary tract symptoms: Secondary | ICD-10-CM | POA: Diagnosis not present

## 2017-07-18 LAB — BASIC METABOLIC PANEL
Anion gap: 9 (ref 5–15)
BUN: 20 mg/dL (ref 8–23)
CO2: 23 mmol/L (ref 22–32)
Calcium: 8.1 mg/dL — ABNORMAL LOW (ref 8.9–10.3)
Chloride: 106 mmol/L (ref 98–111)
Creatinine, Ser: 1.26 mg/dL — ABNORMAL HIGH (ref 0.61–1.24)
GFR calc Af Amer: >60 mL/min (ref >60)
GFR calc non Af Amer: 56 mL/min — ABNORMAL LOW (ref >60)
Glucose, Bld: 145 mg/dL — ABNORMAL HIGH (ref 70–99)
Potassium: 3.7 mmol/L (ref 3.5–5.1)
Sodium: 138 mmol/L (ref 135–145)

## 2017-07-18 LAB — SURGICAL PATHOLOGY

## 2017-07-18 LAB — CBC
HCT: 33.5 % — ABNORMAL LOW (ref 40.0–52.0)
Hemoglobin: 11.5 g/dL — ABNORMAL LOW (ref 13.0–18.0)
MCH: 32.2 pg (ref 26.0–34.0)
MCHC: 34.5 g/dL (ref 32.0–36.0)
MCV: 93.5 fL (ref 80.0–100.0)
PLATELETS: 274 10*3/uL (ref 150–440)
RBC: 3.58 MIL/uL — AB (ref 4.40–5.90)
RDW: 13.2 % (ref 11.5–14.5)
WBC: 12.3 10*3/uL — ABNORMAL HIGH (ref 3.8–10.6)

## 2017-07-18 MED ORDER — OXYBUTYNIN CHLORIDE 5 MG PO TABS: 5.0000 mg | ORAL_TABLET | Freq: Three times a day (TID) | ORAL | 0 refills | Status: DC | PRN

## 2017-07-18 MED ORDER — OXYCODONE HCL 5 MG PO TABS: 5.0000 mg | ORAL_TABLET | ORAL | 0 refills | Status: DC | PRN

## 2017-07-18 MED ORDER — FINASTERIDE 5 MG PO TABS: 5.0000 mg | ORAL_TABLET | Freq: Every day | ORAL | 0 refills | Status: DC

## 2017-07-18 MED ORDER — DOCUSATE SODIUM 100 MG PO CAPS: 100.0000 mg | ORAL_CAPSULE | Freq: Two times a day (BID) | ORAL | 0 refills | Status: DC

## 2017-07-18 NOTE — Discharge Summary (Signed)
Date of admission: 07/17/2017  Date of discharge: 07/18/2017  Admission diagnosis: BPH with obstruction  Discharge diagnosis: BPH with obstruction      S/P TURP    Secondary diagnoses:  Patient Active Problem List   Diagnosis Date Noted  . BPH with urinary obstruction 07/17/2017  . Status post left knee replacement 05/15/2017  . Numbness 12/11/2015    History and Physical: For full details, please see admission history and physical. Briefly, Thomas Newman is a 72 y.o. year old patient with BPH with LU TS.    Hospital Course: Patient tolerated the procedure well.  He was then transferred to the floor after an uneventful PACU stay.  His hospital course was uncomplicated.  On POD# 1 he had met discharge criteria: was eating a regular diet, was up and ambulating independently,  pain was well controlled, was voiding without a catheter, and was ready to for discharge.  CBI was discontinued and urine remained light pink.  Fill and pull performed today.    Physical Exam  Constitutional: Well nourished. Alert and oriented, No acute distress. HEENT: [place] AT, moist mucus membranes. Trachea midline, no masses. Cardiovascular: No clubbing, cyanosis, or edema. Respiratory: Normal respiratory effort, no increased work of breathing. GI: Abdomen is soft, non tender, non distended, no abdominal masses. Liver and spleen not palpable.  No hernias appreciated.  Stool sample for occult testing is not indicated.   GU: No CVA tenderness.  No bladder fullness or masses.  Foley in place.  Urine pink tinged.  Skin: No rashes, bruises or suspicious lesions. Lymph: No cervical or inguinal adenopathy. Neurologic: Grossly intact, no focal deficits, moving all 4 extremities. Psychiatric: Normal mood and affect.    Laboratory values:  Recent Labs    07/18/17 0502  WBC 12.3*  HGB 11.5*  HCT 33.5*   Recent Labs    07/18/17 0502  NA 138  K 3.7  CL 106  CO2 23  GLUCOSE 145*  BUN 20  CREATININE 1.26*   CALCIUM 8.1*   No results for input(s): LABPT, INR in the last 72 hours. No results for input(s): LABURIN in the last 72 hours. Results for orders placed or performed in visit on 07/07/17  Microscopic Examination     Status: Abnormal   Collection Time: 07/07/17  9:40 AM  Result Value Ref Range Status   WBC, UA None seen 0 - 5 /hpf Final   RBC, UA 0-2 0 - 2 /hpf Final   Epithelial Cells (non renal) None seen 0 - 10 /hpf Final   Mucus, UA Present (A) Not Estab. Final   Bacteria, UA Few (A) None seen/Few Final  CULTURE, URINE COMPREHENSIVE     Status: None   Collection Time: 07/07/17 10:36 AM  Result Value Ref Range Status   Urine Culture, Comprehensive Final report  Final   Organism ID, Bacteria Comment  Final    Comment: No growth in 36 - 48 hours.    Disposition: Home  Discharge instruction: The patient was instructed to be ambulatory but told to refrain from heavy lifting, strenuous activity, or driving.   Discharge medications:  Allergies as of 07/18/2017      Reactions   Acetaminophen Hives      Medication List    TAKE these medications   docusate sodium 100 MG capsule Commonly known as:  COLACE Take 1 capsule (100 mg total) by mouth 2 (two) times daily.   finasteride 5 MG tablet Commonly known as:  PROSCAR Take 1  tablet (5 mg total) by mouth daily.   hydrochlorothiazide 25 MG tablet Commonly known as:  HYDRODIURIL Take 25 mg by mouth daily.   Melatonin 5 MG Tabs Take 5 mg by mouth at bedtime as needed (sleep).   nitroGLYCERIN 0.3 MG SL tablet Commonly known as:  NITROSTAT Place 0.3 mg under the tongue every 5 (five) minutes as needed for chest pain.   oxybutynin 5 MG tablet Commonly known as:  DITROPAN Take 1 tablet (5 mg total) by mouth every 8 (eight) hours as needed for bladder spasms.   oxyCODONE 5 MG immediate release tablet Commonly known as:  Oxy IR/ROXICODONE Take 1 tablet (5 mg total) by mouth every 4 (four) hours as needed for moderate pain.    oxymetazoline 0.05 % nasal spray Commonly known as:  AFRIN Place 1 spray into both nostrils daily as needed for congestion.   rivaroxaban 20 MG Tabs tablet Commonly known as:  XARELTO Take 1 tablet (20 mg total) by mouth daily.   SENIOR VITES Tbcr Take 1 tablet by mouth daily.   tamsulosin 0.4 MG Caps capsule Commonly known as:  FLOMAX Take 0.4 mg by mouth daily.       Followup: Patient is scheduled for follow up on 08/30/2017.

## 2017-07-18 NOTE — Progress Notes (Signed)
Thomas Newman to be D/C'd Home per MD order.  Discussed prescriptions and follow up appointments with the patient. Prescriptions given to patient, medication list explained in detail. Pt verbalized understanding.  Allergies as of 07/18/2017      Reactions   Acetaminophen Hives      Medication List    TAKE these medications   docusate sodium 100 MG capsule Commonly known as:  COLACE Take 1 capsule (100 mg total) by mouth 2 (two) times daily.   finasteride 5 MG tablet Commonly known as:  PROSCAR Take 1 tablet (5 mg total) by mouth daily.   hydrochlorothiazide 25 MG tablet Commonly known as:  HYDRODIURIL Take 25 mg by mouth daily.   Melatonin 5 MG Tabs Take 5 mg by mouth at bedtime as needed (sleep).   nitroGLYCERIN 0.3 MG SL tablet Commonly known as:  NITROSTAT Place 0.3 mg under the tongue every 5 (five) minutes as needed for chest pain.   oxybutynin 5 MG tablet Commonly known as:  DITROPAN Take 1 tablet (5 mg total) by mouth every 8 (eight) hours as needed for bladder spasms.   oxyCODONE 5 MG immediate release tablet Commonly known as:  Oxy IR/ROXICODONE Take 1 tablet (5 mg total) by mouth every 4 (four) hours as needed for moderate pain.   oxymetazoline 0.05 % nasal spray Commonly known as:  AFRIN Place 1 spray into both nostrils daily as needed for congestion.   rivaroxaban 20 MG Tabs tablet Commonly known as:  XARELTO Take 1 tablet (20 mg total) by mouth daily.   SENIOR VITES Tbcr Take 1 tablet by mouth daily.   tamsulosin 0.4 MG Caps capsule Commonly known as:  FLOMAX Take 0.4 mg by mouth daily.       Vitals:   07/18/17 0035 07/18/17 0426  BP: (!) 150/97 134/77  Pulse: 77 64  Resp: 18 20  Temp: 98.2 F (36.8 C) 98.2 F (36.8 C)  SpO2: 98% 98%    Skin clean, dry and intact without evidence of skin break down, no evidence of skin tears noted. IV catheter discontinued intact. Site without signs and symptoms of complications. Dressing and pressure  applied. Pt denies pain at this time. No complaints noted. Foley removed with no complication and pt urinated before discharged.  An After Visit Summary was printed and given to the patient. Patient D/C home via private auto.  Thomas Newman

## 2017-07-18 NOTE — Care Management Obs Status (Signed)
MEDICARE OBSERVATION STATUS NOTIFICATION   Patient Details  Name: Marybelle KillingsJohn G Phetteplace MRN: [id number] Date of Birth: 04-21-1945   Medicare Observation Status Notification Given:  No(Admitted obs less than 24 hours)    Chapman FitchBOWEN, Roman Dubuc T, RN 07/18/2017, 9:56 AM

## 2017-08-30 ENCOUNTER — Ambulatory Visit (INDEPENDENT_AMBULATORY_CARE_PROVIDER_SITE_OTHER): Payer: Medicare HMO | Admitting: Urology

## 2017-08-30 ENCOUNTER — Encounter: Payer: Self-pay | Admitting: Urology

## 2017-08-30 VITALS — BP 149/87 | HR 75 | Ht 68.0 in | Wt 177.0 lb

## 2017-08-30 DIAGNOSIS — N138 Other obstructive and reflux uropathy: Secondary | ICD-10-CM | POA: Diagnosis not present

## 2017-08-30 DIAGNOSIS — N401 Enlarged prostate with lower urinary tract symptoms: Secondary | ICD-10-CM

## 2017-08-30 LAB — BLADDER SCAN AMB NON-IMAGING

## 2017-08-30 NOTE — Progress Notes (Signed)
08/30/2017 10:07 AM   Thomas Newman 21-Apr-1945 [id number]  Referring provider: Dortha Kern, MD [address] [place], [place] [address]  Chief Complaint  Patient presents with  . Benign Prostatic Hypertrophy    6wk post op    HPI: 72 year old presents as postop visit following TURP on 07/17/2017.  Preoperatively, he was in urinary retention.  He been taking Flomax for many years.  He was started on finasteride in the setting of urinary retention just prior to surgery.  TRUS volume 57 g.  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  Preop PSA 3.5.  PVR today 0   He notes today that his urinary symptoms have improved dramatically.  He no longer stops and starts his stream.  He flow is greatly improved.  He continues to have nocturia x 2 which is down from almost hourly preop.   IPSS    Row Name 08/30/17 1000         International Prostate Symptom Score   How often have you had the sensation of not emptying your bladder?  Not at All     How often have you had to urinate less than every two hours?  Not at All     How often have you found you stopped and started again several times when you urinated?  Not at All     How often have you found it difficult to postpone urination?  Not at All     How often have you had a weak urinary stream?  Not at All     How often have you had to strain to start urination?  Not at All     How many times did you typically get up at night to urinate?  2 Times     Total IPSS Score  2       Quality of Life due to urinary symptoms   If you were to spend the rest of your life with your urinary condition just the way it is now how would you feel about that?  Pleased        Score:  1-7 Mild 8-19 Moderate 20-35 Severe     PMH: Past Medical History:  Diagnosis Date  . Arthritis   . Atrial fibrillation (HCC)   . Dyspnea    WORKUP NO CAUSE FOUND  . Dysrhythmia    Afib  . Hypertension   . Stroke [facility])    POSSIBLE No residual effects    Surgical History: Past Surgical History:  Procedure Laterality Date  . CARPAL TUNNEL RELEASE    . CATARACT EXTRACTION W/PHACO Right 05/03/2016   Procedure: CATARACT EXTRACTION PHACO AND INTRAOCULAR LENS PLACEMENT (IOC);  Surgeon: Galen Manila, MD;  Location: [facility] ORS;  Service: Ophthalmology;  Laterality: Right;  [place] 00:35 AP% 14.2 CDE 5.04 fluid pack lot # [id number]  . HERNIA REPAIR Left 2000   inguinal  . TOTAL KNEE ARTHROPLASTY Left 05/15/2017   Procedure: TOTAL KNEE ARTHROPLASTY;  Surgeon: Lyndle Herrlich, MD;  Location: [facility] ORS;  Service: Orthopedics;  Laterality: Left;  . TRANSURETHRAL RESECTION OF PROSTATE N/A 07/17/2017   Procedure: TRANSURETHRAL RESECTION OF THE PROSTATE (TURP);  Surgeon: Vanna Scotland, MD;  Location: [facility] ORS;  Service: Urology;  Laterality: N/A;    Home Medications:  Allergies as of 08/30/2017      Reactions   Acetaminophen Hives      Medication List        Accurate  as of 08/30/17 10:07 AM. Always use your most recent med list.          hydrochlorothiazide 25 MG tablet Commonly known as:  HYDRODIURIL Take 25 mg by mouth daily.   Melatonin 5 MG Tabs Take 5 mg by mouth at bedtime as needed (sleep).   nitroGLYCERIN 0.3 MG SL tablet Commonly known as:  NITROSTAT Place 0.3 mg under the tongue every 5 (five) minutes as needed for chest pain.   oxymetazoline 0.05 % nasal spray Commonly known as:  AFRIN Place 1 spray into both nostrils daily as needed for congestion.   rivaroxaban 20 MG Tabs tablet Commonly known as:  XARELTO Take 1 tablet (20 mg total) by mouth daily.   SENIOR VITES Tbcr Take 1 tablet by mouth daily.       Allergies:  Allergies  Allergen Reactions  . Acetaminophen Hives    Family History: Family History  Problem Relation Age of Onset  . Atrial fibrillation Mother   . Heart attack Father   . Breast cancer Sister   . Breast cancer Sister      Social History:  reports that he has never smoked. He has never used smokeless tobacco. He reports that he drinks about 1.2 oz of alcohol per week. He reports that he does not use drugs.  ROS: UROLOGY Frequent Urination?: No Hard to postpone urination?: No Burning/pain with urination?: No Get up at night to urinate?: Yes Leakage of urine?: Yes Urine stream starts and stops?: No Trouble starting stream?: No Do you have to strain to urinate?: No Blood in urine?: No Urinary tract infection?: No Sexually transmitted disease?: No Injury to kidneys or bladder?: No Painful intercourse?: No Weak stream?: No Erection problems?: No Penile pain?: No  Gastrointestinal Nausea?: No Vomiting?: No Indigestion/heartburn?: No Diarrhea?: No Constipation?: No  Constitutional Fever: No Night sweats?: No Weight loss?: No Fatigue?: No  Skin Skin rash/lesions?: No Itching?: No  Eyes Blurred vision?: No Double vision?: No  Ears/Nose/Throat Sore throat?: No Sinus problems?: No  Hematologic/Lymphatic Swollen glands?: No Easy bruising?: No  Cardiovascular Leg swelling?: No Chest pain?: No  Respiratory Cough?: No Shortness of breath?: No  Endocrine Excessive thirst?: No  Musculoskeletal Back pain?: No Joint pain?: No  Neurological Headaches?: No Dizziness?: No  Psychologic Depression?: No Anxiety?: No  Physical Exam: BP (!) 149/87   Pulse 75   Ht 5\' 8"  (1.727 m)   Wt 177 lb (80.3 kg)   BMI 26.91 kg/m   Constitutional:  Alert and oriented, No acute distress. HEENT: [place] AT, moist mucus membranes.  Trachea midline, no masses. Cardiovascular: No clubbing, cyanosis, or edema. Respiratory: Normal respiratory effort, no increased work of breathing.. Skin: No rashes, bruises or suspicious lesions. Neurologic: Grossly intact, no focal deficits, moving all 4 extremities. Psychiatric: Normal mood and affect.  Laboratory Data: Lab Results  Component Value Date    WBC 12.3 (H) 07/18/2017   HGB 11.5 (L) 07/18/2017   HCT 33.5 (L) 07/18/2017   MCV 93.5 07/18/2017   PLT 274 07/18/2017    Lab Results  Component Value Date   CREATININE 1.26 (H) 07/18/2017    Lab Results  Component Value Date   HGBA1C 6.0 (H) 12/12/2015    Urinalysis N/a  Pertinent Imaging: Results for orders placed or performed in visit on 08/30/17  BLADDER SCAN AMB NON-IMAGING  Result Value Ref Range   Scan Result 0ml     Assessment & Plan:    1. Benign prostatic hyperplasia with urinary  obstruction Status post TURP with excellent results Adequate bladder emptying today Plan to stop finasteride/Flomax He will let us know in a few days if he has any issues off of Flomax Plan for recheck in 1 year with PSA, if he is doing well at that point can be discharged from further urologic care - BLADDER SCAN AMB NON-IMAGING   Return in about 1 year (around 08/31/2018) for PSA/  IPSS/ PVR.  [name] Susi Goslin, MD  [facility] [address], [address] [place], [place] [address] [phone]

## 2017-09-12 ENCOUNTER — Telehealth: Payer: Self-pay | Admitting: Urology

## 2017-09-12 NOTE — Telephone Encounter (Signed)
Pt would like a call.  He had a TURP on 6/24, things were going well.  Now he has blood in urine a couple times per day.  He didn't know if this was normal, or if he should be concerned.  [phone]

## 2017-09-13 NOTE — Telephone Encounter (Signed)
Patient was told that this was ok with him being on blood thinners it can still happen.  If he develops any other urinary symptoms to call the office for a UA drop off

## 2018-09-05 ENCOUNTER — Ambulatory Visit: Payer: Medicare HMO | Admitting: Urology

## 2018-09-10 ENCOUNTER — Other Ambulatory Visit: Payer: Medicare HMO

## 2018-09-10 ENCOUNTER — Other Ambulatory Visit: Payer: Self-pay

## 2018-09-10 ENCOUNTER — Other Ambulatory Visit: Payer: Self-pay | Admitting: Family Medicine

## 2018-09-10 DIAGNOSIS — N401 Enlarged prostate with lower urinary tract symptoms: Secondary | ICD-10-CM

## 2018-09-10 DIAGNOSIS — N138 Other obstructive and reflux uropathy: Secondary | ICD-10-CM

## 2018-09-11 LAB — PSA: Prostate Specific Ag, Serum: 0.6 ng/mL (ref 0.0–4.0)

## 2018-09-19 ENCOUNTER — Ambulatory Visit: Payer: Medicare HMO | Admitting: Urology

## 2018-10-03 ENCOUNTER — Encounter: Payer: Self-pay | Admitting: Urology

## 2018-10-03 ENCOUNTER — Ambulatory Visit (INDEPENDENT_AMBULATORY_CARE_PROVIDER_SITE_OTHER): Payer: Medicare HMO | Admitting: Urology

## 2018-10-03 ENCOUNTER — Other Ambulatory Visit: Payer: Self-pay

## 2018-10-03 VITALS — BP 148/80 | HR 80 | Ht 68.0 in | Wt 181.0 lb

## 2018-10-03 DIAGNOSIS — N401 Enlarged prostate with lower urinary tract symptoms: Secondary | ICD-10-CM

## 2018-10-03 DIAGNOSIS — Z87898 Personal history of other specified conditions: Secondary | ICD-10-CM

## 2018-10-03 DIAGNOSIS — N138 Other obstructive and reflux uropathy: Secondary | ICD-10-CM

## 2018-10-03 NOTE — Progress Notes (Signed)
10/03/2018 11:26 AM   Thomas KillingsJohn G Kreischer 11-Dec-1945 [id number]  Referring provider: Dortha KernBliss, Laura K, MD [address] [place],  [place] [id number]  Chief Complaint  Patient presents with  . Benign Prostatic Hypertrophy    HPI: 73 year old male with history history of BPH status post TURP 2019 for routine annual follow-up.  He is no longer taking any BPH meds.  He has no voiding complaints today.  His IPSS is As below.    Adequate emptying today.  PSA 0.6   IPSS    Row Name 10/03/18 1100         International Prostate Symptom Score   How often have you had the sensation of not emptying your bladder?  Not at All     How often have you had to urinate less than every two hours?  Not at All     How often have you found you stopped and started again several times when you urinated?  Not at All     How often have you found it difficult to postpone urination?  Not at All     How often have you had a weak urinary stream?  Not at All     How often have you had to strain to start urination?  Not at All     How many times did you typically get up at night to urinate?  5 Times     Total IPSS Score  5       Quality of Life due to urinary symptoms   If you were to spend the rest of your life with your urinary condition just the way it is now how would you feel about that?  Pleased        Score:  1-7 Mild 8-19 Moderate 20-35 Severe   PMH: Past Medical History:  Diagnosis Date  . Arthritis   . Atrial fibrillation (HCC)   . Dyspnea    WORKUP NO CAUSE FOUND  . Dysrhythmia    Afib  . Hypertension   . Stroke [facility](HCC)    POSSIBLE No residual effects    Surgical History: Past Surgical History:  Procedure Laterality Date  . CARPAL TUNNEL RELEASE    . CATARACT EXTRACTION W/PHACO Right 05/03/2016   Procedure: CATARACT EXTRACTION PHACO AND INTRAOCULAR LENS PLACEMENT (IOC);  Surgeon: Galen ManilaWilliam Porfilio, MD;  Location: [facility] ORS;  Service: Ophthalmology;  Laterality: Right;  [place] 00:35 AP%  14.2 CDE 5.04 fluid pack lot # [id number]  . HERNIA REPAIR Left 2000   inguinal  . TOTAL KNEE ARTHROPLASTY Left 05/15/2017   Procedure: TOTAL KNEE ARTHROPLASTY;  Surgeon: Lyndle HerrlichBowers, James R, MD;  Location: [facility] ORS;  Service: Orthopedics;  Laterality: Left;  . TRANSURETHRAL RESECTION OF PROSTATE N/A 07/17/2017   Procedure: TRANSURETHRAL RESECTION OF THE PROSTATE (TURP);  Surgeon: Vanna ScotlandBrandon, [place] Greb, MD;  Location: [facility] ORS;  Service: Urology;  Laterality: N/A;    Home Medications:  Allergies as of 10/03/2018      Reactions   Acetaminophen Hives      Medication List       Accurate as of October 03, 2018 11:26 AM. If you have any questions, ask your nurse or doctor.        STOP taking these medications   hydrochlorothiazide 25 MG tablet Commonly known as: HYDRODIURIL Stopped by: [name] Vitor Overbaugh, MD   Melatonin 5 MG Tabs Stopped by: [name] Zella Dewan, MD   oxymetazoline 0.05 % nasal spray Commonly known as: AFRIN Stopped by: [name] Ezequias Lard, MD  TAKE these medications   amLODipine 10 MG tablet Commonly known as: NORVASC   nitroGLYCERIN 0.3 MG SL tablet Commonly known as: NITROSTAT Place 0.3 mg under the tongue every 5 (five) minutes as needed for chest pain.   rivaroxaban 20 MG Tabs tablet Commonly known as: XARELTO Take 1 tablet (20 mg total) by mouth daily.   Senior Vites Tbcr Take 1 tablet by mouth daily.   triamterene-hydrochlorothiazide 75-50 MG tablet Commonly known as: MAXZIDE triamterene 75 mg-hydrochlorothiazide 50 mg tablet       Allergies:  Allergies  Allergen Reactions  . Acetaminophen Hives    Family History: Family History  Problem Relation Age of Onset  . Atrial fibrillation Mother   . Heart attack Father   . Breast cancer Sister   . Breast cancer Sister     Social History:  reports that he has never smoked. He has never used smokeless tobacco. He reports current alcohol use of about 2.0 standard drinks of alcohol per week. He reports that he  does not use drugs.  ROS: UROLOGY Frequent Urination?: No Hard to postpone urination?: No Burning/pain with urination?: No Get up at night to urinate?: No Leakage of urine?: No Urine stream starts and stops?: No Trouble starting stream?: No Do you have to strain to urinate?: No Blood in urine?: No Urinary tract infection?: No Sexually transmitted disease?: No Injury to kidneys or bladder?: No Painful intercourse?: No Weak stream?: No Erection problems?: No Penile pain?: No  Gastrointestinal Nausea?: No Vomiting?: No Indigestion/heartburn?: No Diarrhea?: No Constipation?: No  Constitutional Fever: No Night sweats?: No Weight loss?: No Fatigue?: No  Skin Skin rash/lesions?: No Itching?: No  Eyes Blurred vision?: No Double vision?: No  Ears/Nose/Throat Sore throat?: No Sinus problems?: No  Hematologic/Lymphatic Swollen glands?: No Easy bruising?: No  Cardiovascular Leg swelling?: No Chest pain?: No  Respiratory Cough?: No Shortness of breath?: No  Endocrine Excessive thirst?: No  Musculoskeletal Back pain?: No Joint pain?: No  Neurological Headaches?: No Dizziness?: No  Psychologic Depression?: No Anxiety?: No  Physical Exam: BP (!) 148/80   Pulse 80   Ht 5\' 8"  (1.727 m)   Wt 181 lb (82.1 kg)   BMI 27.52 kg/m   Constitutional:  Alert and oriented, No acute distress. HEENT: [place] AT, moist mucus membranes.  Trachea midline, no masses. Cardiovascular: No clubbing, cyanosis, or edema. Respiratory: Normal respiratory effort, no increased work of breathing. Skin: No rashes, bruises or suspicious lesions. Neurologic: Grossly intact, no focal deficits, moving all 4 extremities. Psychiatric: Normal mood and affect.  Laboratory Data: Lab Results  Component Value Date   WBC 12.3 (H) 07/18/2017   HGB 11.5 (L) 07/18/2017   HCT 33.5 (L) 07/18/2017   MCV 93.5 07/18/2017   PLT 274 07/18/2017    Lab Results  Component Value Date    CREATININE 1.26 (H) 07/18/2017    Lab Results  Component Value Date   HGBA1C 6.0 (H) 12/12/2015    Pertinent Imaging: PVR 15 cc  Assessment & Plan:    1. Benign prostatic hyperplasia without urinary tract symptoms Excellent control of urinary symptoms No meds  2. History of urinary retention PVR minimal today  Appropriate from discharge from urologic practice, advised to follow as needed.  Hollice Espy, MD  [facility] [address] [place], [place] [address] [phone]

## 2018-12-25 ENCOUNTER — Encounter: Payer: Self-pay | Admitting: Family Medicine

## 2018-12-26 ENCOUNTER — Other Ambulatory Visit: Payer: Self-pay

## 2018-12-26 ENCOUNTER — Telehealth: Payer: Self-pay

## 2018-12-26 DIAGNOSIS — Z1211 Encounter for screening for malignant neoplasm of colon: Secondary | ICD-10-CM

## 2018-12-26 MED ORDER — NA SULFATE-K SULFATE-MG SULF 17.5-3.13-1.6 GM/177ML PO SOLN: 1.0000 | Freq: Once | ORAL | 0 refills | Status: AC

## 2018-12-26 NOTE — Telephone Encounter (Signed)
Gastroenterology Pre-Procedure Review  Request Date: Friday 01/04/19 Requesting Physician: Dr. Bonna Gains  PATIENT REVIEW QUESTIONS: The patient responded to the following health history questions as indicated:    1. Are you having any GI issues? no 2. Do you have a personal history of Polyps? yes (5 years ago) 3. Do you have a family history of Colon Cancer or Polyps? no 4. Diabetes Mellitus? no 5. Joint replacements in the past 12 months?yes (L. Knee Replacement, TURP 2019) 6. Major health problems in the past 3 months?no 7. Any artificial heart valves, MVP, or defibrillator?no    MEDICATIONS & ALLERGIES:    Patient reports the following regarding taking any anticoagulation/antiplatelet therapy:   Plavix, Coumadin, Eliquis, Xarelto, Lovenox, Pradaxa, Brilinta, or Effient? yes (Blood Thinner sent to Dr. Marianna Payment office.) Aspirin? no  Patient confirms/reports the following medications:  Current Outpatient Medications  Medication Sig Dispense Refill  . amLODipine (NORVASC) 10 MG tablet     . Multiple Vitamins-Minerals (SENIOR VITES) TBCR Take 1 tablet by mouth daily.    . Na Sulfate-K Sulfate-Mg Sulf 17.5-3.13-1.6 GM/177ML SOLN Take 1 kit by mouth once for 1 dose. 354 mL 0  . nitroGLYCERIN (NITROSTAT) 0.3 MG SL tablet Place 0.3 mg under the tongue every 5 (five) minutes as needed for chest pain.    . rivaroxaban (XARELTO) 20 MG TABS tablet Take 1 tablet (20 mg total) by mouth daily. 30 tablet 0  . triamterene-hydrochlorothiazide (MAXZIDE) 75-50 MG tablet triamterene 75 mg-hydrochlorothiazide 50 mg tablet     No current facility-administered medications for this visit.     Patient confirms/reports the following allergies:  Allergies  Allergen Reactions  . Acetaminophen Hives    No orders of the defined types were placed in this encounter.   AUTHORIZATION INFORMATION Primary Insurance: 1D#: Group #:  Secondary Insurance: 1D#: Group #:  SCHEDULE INFORMATION: Date:  Friday 01/04/19 Time: Location:[facility]

## 2018-12-27 ENCOUNTER — Telehealth: Payer: Self-pay | Admitting: Gastroenterology

## 2018-12-27 ENCOUNTER — Telehealth: Payer: Self-pay

## 2018-12-27 NOTE — Telephone Encounter (Signed)
Received voice message from patient stating he has checked with his insurance and will need to change the location of his colonoscopy from [facility] to [facility], however Bonna Gains is in network to do the colonoscopy.  Advised him of Dr. Michele Mcalpine availability at [facility] 12/09, 12/16, 12/17.  Will await for him to call back in regards to rescheduling and changing locations.  Thanks [employer]

## 2018-12-27 NOTE — Telephone Encounter (Signed)
LVM already with patient to call me back in regards to moving to [facility] with Dr. Bonna Gains for his colonoscopy.  Thanks [employer]

## 2018-12-27 NOTE — Telephone Encounter (Signed)
Debbie from [facility] is calling pt is out [facility] with [place] and needs to be moved to [facility] she will pull him out of [place] please call pt and Jackelyn Poling

## 2018-12-28 ENCOUNTER — Other Ambulatory Visit: Payer: Self-pay

## 2018-12-28 ENCOUNTER — Telehealth: Payer: Self-pay

## 2018-12-28 NOTE — Telephone Encounter (Signed)
Patient was contacted by Tressia Miners.  He said that Traci assured him that there would not be any problem with billing. He will remain as scheduled at [facility] on 01/04/19.  Thanks [employer]

## 2019-01-01 ENCOUNTER — Other Ambulatory Visit
Admission: RE | Admit: 2019-01-01 | Discharge: 2019-01-01 | Disposition: A | Discharge status: Home / Self Care | Payer: Medicare HMO | Source: Ambulatory Visit | Attending: Gastroenterology | Admitting: Gastroenterology

## 2019-01-01 DIAGNOSIS — Z20828 Contact with and (suspected) exposure to other viral communicable diseases: Secondary | ICD-10-CM | POA: Insufficient documentation

## 2019-01-01 DIAGNOSIS — Z01812 Encounter for preprocedural laboratory examination: Secondary | ICD-10-CM | POA: Diagnosis present

## 2019-01-02 LAB — SARS CORONAVIRUS 2 (TAT 6-24 HRS): SARS Coronavirus 2: NEGATIVE

## 2019-01-02 NOTE — Discharge Instructions (Signed)

## 2019-01-04 ENCOUNTER — Ambulatory Visit: Payer: Medicare HMO | Admitting: Anesthesiology

## 2019-01-04 ENCOUNTER — Encounter
Admission: RE | Disposition: A | Discharge status: Home / Self Care | Payer: Self-pay | Source: Home / Self Care | Attending: Gastroenterology

## 2019-01-04 ENCOUNTER — Encounter: Payer: Self-pay | Admitting: Gastroenterology

## 2019-01-04 ENCOUNTER — Other Ambulatory Visit: Payer: Self-pay

## 2019-01-04 ENCOUNTER — Ambulatory Visit
Admission: RE | Admit: 2019-01-04 | Discharge: 2019-01-04 | Disposition: A | Discharge status: Home / Self Care | Payer: Medicare HMO | Attending: Gastroenterology | Admitting: Gastroenterology

## 2019-01-04 DIAGNOSIS — K648 Other hemorrhoids: Secondary | ICD-10-CM | POA: Diagnosis not present

## 2019-01-04 DIAGNOSIS — Z7901 Long term (current) use of anticoagulants: Secondary | ICD-10-CM | POA: Insufficient documentation

## 2019-01-04 DIAGNOSIS — K5730 Diverticulosis of large intestine without perforation or abscess without bleeding: Secondary | ICD-10-CM | POA: Diagnosis not present

## 2019-01-04 DIAGNOSIS — Z96652 Presence of left artificial knee joint: Secondary | ICD-10-CM | POA: Insufficient documentation

## 2019-01-04 DIAGNOSIS — Z79899 Other long term (current) drug therapy: Secondary | ICD-10-CM | POA: Insufficient documentation

## 2019-01-04 DIAGNOSIS — Z886 Allergy status to analgesic agent status: Secondary | ICD-10-CM | POA: Diagnosis not present

## 2019-01-04 DIAGNOSIS — I4891 Unspecified atrial fibrillation: Secondary | ICD-10-CM | POA: Diagnosis not present

## 2019-01-04 DIAGNOSIS — K635 Polyp of colon: Secondary | ICD-10-CM

## 2019-01-04 DIAGNOSIS — M1990 Unspecified osteoarthritis, unspecified site: Secondary | ICD-10-CM | POA: Insufficient documentation

## 2019-01-04 DIAGNOSIS — Z8249 Family history of ischemic heart disease and other diseases of the circulatory system: Secondary | ICD-10-CM | POA: Diagnosis not present

## 2019-01-04 DIAGNOSIS — Z86010 Personal history of colonic polyps: Secondary | ICD-10-CM | POA: Insufficient documentation

## 2019-01-04 DIAGNOSIS — I10 Essential (primary) hypertension: Secondary | ICD-10-CM | POA: Insufficient documentation

## 2019-01-04 DIAGNOSIS — Z1211 Encounter for screening for malignant neoplasm of colon: Secondary | ICD-10-CM | POA: Diagnosis not present

## 2019-01-04 HISTORY — DX: Anemia, unspecified: D64.9

## 2019-01-04 HISTORY — DX: Angina pectoris, unspecified: I20.9

## 2019-01-04 HISTORY — PX: COLONOSCOPY WITH PROPOFOL: SHX5780

## 2019-01-04 SURGERY — COLONOSCOPY WITH PROPOFOL
Anesthesia: General

## 2019-01-04 MED ORDER — SODIUM CHLORIDE 0.9 % IV SOLN: INTRAVENOUS | Status: DC

## 2019-01-04 MED ORDER — LIDOCAINE HCL (CARDIAC) PF 100 MG/5ML IV SOSY
PREFILLED_SYRINGE | INTRAVENOUS | Status: DC | PRN
  Administered 2019-01-04: 40 mg via INTRAVENOUS

## 2019-01-04 MED ORDER — LACTATED RINGERS IV SOLN
100.0000 mL/h | INTRAVENOUS | Status: DC
  Administered 2019-01-04: 100 mL/h via INTRAVENOUS

## 2019-01-04 MED ORDER — STERILE WATER FOR IRRIGATION IR SOLN
Status: DC | PRN
  Administered 2019-01-04: 11:00:00

## 2019-01-04 MED ORDER — PROPOFOL 10 MG/ML IV BOLUS
INTRAVENOUS | Status: DC | PRN
  Administered 2019-01-04: 100 mg via INTRAVENOUS

## 2019-01-04 SURGICAL SUPPLY — 24 items
CANISTER SUCT 1200ML W/VALVE (MISCELLANEOUS) ×3 IMPLANT
CLIP HMST 235XBRD CATH ROT (MISCELLANEOUS) IMPLANT
CLIP RESOLUTION 360 11X235 (MISCELLANEOUS)
ELECT REM PT RETURN 9FT ADLT (ELECTROSURGICAL)
ELECTRODE REM PT RTRN 9FT ADLT (ELECTROSURGICAL) IMPLANT
FCP ESCP3.2XJMB 240X2.8X (MISCELLANEOUS) ×1
FORCEPS BIOP RAD 4 LRG CAP 4 (CUTTING FORCEPS) IMPLANT
FORCEPS BIOP RJ4 240 W/NDL (MISCELLANEOUS) ×2
FORCEPS ESCP3.2XJMB 240X2.8X (MISCELLANEOUS) ×1 IMPLANT
GOWN CVR UNV OPN BCK APRN NK (MISCELLANEOUS) ×2 IMPLANT
GOWN ISOL THUMB LOOP REG UNIV (MISCELLANEOUS) ×4
INJECTOR VARIJECT VIN23 (MISCELLANEOUS) IMPLANT
KIT DEFENDO VALVE AND CONN (KITS) IMPLANT
KIT ENDO PROCEDURE OLY (KITS) ×3 IMPLANT
MARKER SPOT ENDO TATTOO 5ML (MISCELLANEOUS) IMPLANT
PROBE APC STR FIRE (PROBE) IMPLANT
RETRIEVER NET ROTH 2.5X230 LF (MISCELLANEOUS) IMPLANT
SNARE SHORT THROW 13M SML OVAL (MISCELLANEOUS) IMPLANT
SNARE SHORT THROW 30M LRG OVAL (MISCELLANEOUS) IMPLANT
SNARE SNG USE RND 15MM (INSTRUMENTS) IMPLANT
SPOT EX ENDOSCOPIC TATTOO (MISCELLANEOUS)
TRAP ETRAP POLY (MISCELLANEOUS) IMPLANT
VARIJECT INJECTOR VIN23 (MISCELLANEOUS)
WATER STERILE IRR 250ML POUR (IV SOLUTION) ×3 IMPLANT

## 2019-01-04 NOTE — Anesthesia Preprocedure Evaluation (Signed)
Anesthesia Evaluation  Patient identified by MRN, date of birth, ID band Patient awake    Reviewed: Allergy & Precautions, H&P , NPO status , Patient's Chart, lab work & pertinent test results  Airway Mallampati: III  TM Distance: >3 FB Neck ROM: full    Dental no notable dental hx.    Pulmonary    Pulmonary exam normal breath sounds clear to auscultation       Cardiovascular hypertension, + dysrhythmias Atrial Fibrillation  Rhythm:regular Rate:Normal     Neuro/Psych CVA    GI/Hepatic   Endo/Other    Renal/GU      Musculoskeletal   Abdominal   Peds  Hematology   Anesthesia Other Findings   Reproductive/Obstetrics                             Anesthesia Physical Anesthesia Plan  ASA: III  Anesthesia Plan: General   Post-op Pain Management:    Induction: Intravenous  PONV Risk Score and Plan: 2 and Propofol infusion, Treatment may vary due to age or medical condition and TIVA  Airway Management Planned: Natural Airway  Additional Equipment:   Intra-op Plan:   Post-operative Plan:   Informed Consent: I have reviewed the patients History and Physical, chart, labs and discussed the procedure including the risks, benefits and alternatives for the proposed anesthesia with the patient or authorized representative who has indicated his/her understanding and acceptance.       Plan Discussed with: CRNA  Anesthesia Plan Comments:         Anesthesia Quick Evaluation

## 2019-01-04 NOTE — H&P (Signed)
Thomas Antigua, MD [address] [place], [place], [place], [address] [address], [place], [place], [place], [address] Phone: [phone]  Fax: [phone]  Primary Care Physician:  Thomas Jude, MD   Pre-Procedure History & Physical: HPI:  Thomas Newman is a 73 y.o. male is here for a colonoscopy.   Past Medical History:  Diagnosis Date  . Anemia   . Anginal pain ([place])   . Arthritis   . Atrial fibrillation ([place])   . Dyspnea    WORKUP NO CAUSE FOUND  . Dysrhythmia    Afib  . Hypertension   . Stroke [facility])    POSSIBLE No residual effects    Past Surgical History:  Procedure Laterality Date  . CARPAL TUNNEL RELEASE    . CATARACT EXTRACTION W/PHACO Right 05/03/2016   Procedure: CATARACT EXTRACTION PHACO AND INTRAOCULAR LENS PLACEMENT (IOC);  Surgeon: Birder Robson, MD;  Location: [facility] ORS;  Service: Ophthalmology;  Laterality: Right;  [place] 00:35 AP% 14.2 CDE 5.04 fluid pack lot # [id number]  . HERNIA REPAIR Left 2000   inguinal  . 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 TRANSURETHRAL RESECTION OF PROSTATE N/A 07/17/2017   Procedure: TRANSURETHRAL RESECTION OF THE PROSTATE (TURP);  Surgeon: Hollice Espy, MD;  Location: [facility] ORS;  Service: Urology;  Laterality: N/A;    Prior to Admission medications   Medication Sig Start Date End Date Taking? Authorizing Provider  amLODipine (NORVASC) 10 MG tablet  05/02/18  Yes [provider]  ferrous sulfate 325 (65 FE) MG tablet Take 325 mg by mouth daily with breakfast.   Yes [provider]  Multiple Vitamins-Minerals (SENIOR VITES) TBCR Take 1 tablet by mouth daily.   Yes [provider]  rivaroxaban (XARELTO) 20 MG TABS tablet Take 1 tablet (20 mg total) by mouth daily. 05/17/17  Yes Lovell Sheehan, MD  triamterene-hydrochlorothiazide (MAXZIDE) 75-50 MG tablet  triamterene 75 mg-hydrochlorothiazide 50 mg tablet   Yes [provider]  nitroGLYCERIN (NITROSTAT) 0.3 MG SL tablet Place 0.3 mg under the tongue every 5 (five) minutes as needed for chest pain.    [provider]    Allergies as of 12/26/2018 - Review Complete 10/03/2018  Allergen Reaction Noted  . Acetaminophen Hives 06/28/2017    Family History  Problem Relation Age of Onset  . Atrial fibrillation Mother   . Heart attack Father   . Breast cancer Sister   . Breast cancer Sister     Social History   Socioeconomic History  . Marital status: Married    Spouse name: Not on file  . Number of children: Not on file  . Years of education: Not on file  . Highest education level: Not on file  Occupational History  . Not on file  Tobacco Use  . Smoking status: Never Smoker  . Smokeless tobacco: Never Used  Substance and Sexual Activity  . Alcohol use: Yes    Alcohol/week: 2.0 standard drinks    Types: 1 Glasses of wine, 1 Cans of beer per week    Comment: occ  . Drug use: No  . Sexual activity: Not on file  Other Topics Concern  . Not on file  Social History Narrative  . Not on file   Social Determinants of Health   Financial Resource Strain:   . Difficulty of Paying Living Expenses: Not on file  Food Insecurity:   . Worried About Programme researcher, broadcasting/film/video in the Last Year: Not on file  . Ran Out of Food in the Last Year: Not on file  Transportation Needs:   . Lack of Transportation (Medical): Not on file  . Lack of Transportation (Non-Medical): Not on file  Physical Activity:   . Days of Exercise per Week: Not on file  . Minutes of Exercise per Session: Not on file  Stress:   . Feeling of Stress : Not on file  Social Connections:   . Frequency of Communication with Friends and Family: Not on file  . Frequency of Social Gatherings with Friends and Family: Not on file  . Attends Religious Services: Not on file  . Active Member of Clubs or Organizations:  Not on file  . Attends Banker Meetings: Not on file  . Marital Status: Not on file  Intimate Partner Violence:   . Fear of Current or Ex-Partner: Not on file  . Emotionally Abused: Not on file  . Physically Abused: Not on file  . Sexually Abused: Not on file    Review of Systems: See HPI, otherwise negative ROS  Physical Exam: BP 129/86   Pulse 80   Temp 97.9 F (36.6 C) (Temporal)   Resp 16   Ht 5\' 8"  (1.727 m)   Wt 80.7 kg   SpO2 100%   BMI 27.06 kg/m  General:   Alert,  pleasant and cooperative in NAD Head:  Normocephalic and atraumatic. Neck:  Supple; no masses or thyromegaly. Lungs:  Clear throughout to auscultation, normal respiratory effort.    Heart:  +S1, +S2, Regular rate and rhythm, No edema. Abdomen:  Soft, nontender and nondistended. Normal bowel sounds, without guarding, and without rebound.   Neurologic:  Alert and  oriented x4;  grossly normal neurologically.  Impression/Plan: Thomas Newman is here for a colonoscopy to be performed for polyp surveillance. Adenoma polyps in 2015 at [facility]  Risks, benefits, limitations, and alternatives regarding  colonoscopy have been reviewed with the patient.  Questions have been answered.  All parties agreeable.   [facility], MD  01/04/2019, 10:51 AM

## 2019-01-04 NOTE — Anesthesia Postprocedure Evaluation (Signed)
Anesthesia Post Note  Patient: Thomas Newman  Procedure(s) Performed: COLONOSCOPY WITH PROPOFOL (N/A )     Patient location during evaluation: PACU Anesthesia Type: General Level of consciousness: awake and alert and oriented Pain management: satisfactory to patient Vital Signs Assessment: post-procedure vital signs reviewed and stable Respiratory status: spontaneous breathing, nonlabored ventilation and respiratory function stable Cardiovascular status: blood pressure returned to baseline and stable Postop Assessment: Adequate PO intake and No signs of nausea or vomiting Anesthetic complications: no    Raliegh Ip

## 2019-01-04 NOTE — Anesthesia Procedure Notes (Signed)
Date/Time: 01/04/2019 11:04 AM Performed by: Jeannene Patella, CRNA Pre-anesthesia Checklist: Patient identified, Emergency Drugs available, Suction available, Patient being monitored and Timeout performed Patient Re-evaluated:Patient Re-evaluated prior to induction Oxygen Delivery Method: Simple face mask

## 2019-01-04 NOTE — Transfer of Care (Signed)
Immediate Anesthesia Transfer of Care Note  Patient: Thomas Newman  Procedure(s) Performed: COLONOSCOPY WITH PROPOFOL (N/A )  Patient Location: PACU  Anesthesia Type: General  Level of Consciousness: awake, alert  and patient cooperative  Airway and Oxygen Therapy: Patient Spontanous Breathing and Patient connected to supplemental oxygen  Post-op Assessment: Post-op Vital signs reviewed, Patient's Cardiovascular Status Stable, Respiratory Function Stable, Patent Airway and No signs of Nausea or vomiting  Post-op Vital Signs: Reviewed and stable  Complications: No apparent anesthesia complications

## 2019-01-04 NOTE — Op Note (Signed)
[facility] Patient Name: Thomas Newman Procedure Date: 01/04/2019 10:47 AM MRN: [id number] Account #: [id number] Date of Birth: [birth year] Admit Type: Outpatient Age: 73 Room: [facility] OR ROOM 01 Gender: Male Note Status: Finalized Procedure:             Colonoscopy Indications:           High risk colon cancer surveillance: Personal history                         of colonic polyps Providers:             Tierney Behl B. Maximino Greenland MD, MD Referring MD:          Dortha Kern (Referring MD) Medicines:             Monitored Anesthesia Care Complications:         No immediate complications. Procedure:             Pre-Anesthesia Assessment:                        - ASA Grade Assessment: II - A patient with mild                         systemic disease.                        - Prior to the procedure, a History and Physical was                         performed, and patient medications, allergies and                         sensitivities were reviewed. The patient's tolerance                         of previous anesthesia was reviewed.                        - The risks and benefits of the procedure and the                         sedation options and risks were discussed with the                         patient. All questions were answered and informed                         consent was obtained.                        - Patient identification and proposed procedure were                         verified prior to the procedure by the physician, the                         nurse, the anesthesiologist, the anesthetist and the                         technician. The procedure was  verified in the                         procedure room.                        After obtaining informed consent, the colonoscope was                         passed under direct vision. Throughout the procedure,                         the patient's blood pressure, pulse, and oxygen                       saturations were monitored continuously. The                         Colonoscope was introduced through the anus and                         advanced to the the cecum, identified by appendiceal                         orifice and ileocecal valve. The colonoscopy was                         performed with ease. The patient tolerated the                         procedure well. The quality of the bowel preparation                         was good. Findings:      The perianal and digital rectal examinations were normal.      Two sessile polyps were found in the ascending colon. The polyps were 3       to 4 mm in size. These polyps were removed with a jumbo cold forceps.       Resection and retrieval were complete.      Multiple diverticula were found in the sigmoid colon.      The exam was otherwise without abnormality.      The rectum, sigmoid colon, descending colon, transverse colon, ascending       colon and cecum appeared normal.      Non-bleeding internal hemorrhoids were found during retroflexion.      No additional abnormalities were found on retroflexion. Impression:            - Two 3 to 4 mm polyps in the ascending colon, removed                         with a jumbo cold forceps. Resected and retrieved.                        - Diverticulosis in the sigmoid colon.                        - The examination was otherwise normal.                        -  The rectum, sigmoid colon, descending colon,                         transverse colon, ascending colon and cecum are normal.                        - Non-bleeding internal hemorrhoids. Recommendation:        - Discharge patient to home (with escort).                        - High fiber diet.                        - Advance diet as tolerated.                        - Continue present medications.                        - Await pathology results.                        - Repeat colonoscopy in 5 years.                         - The findings and recommendations were discussed with                         the patient.                        - The findings and recommendations were discussed with                         the patient's family.                        - Return to primary care physician as previously                         scheduled. Procedure Code(s):     --- Professional ---                        [phone], Colonoscopy, flexible; with biopsy, single or                         multiple Diagnosis Code(s):     --- Professional ---                        Z86.010, Personal history of colonic polyps                        K63.5, Polyp of colon CPT copyright 2019 American Medical Association. All rights reserved. The codes documented in this report are preliminary and upon coder review may  be revised to meet current compliance requirements.  Vonda Antigua, MD Margretta Sidle B. Bonna Gains MD, MD 01/04/2019 11:23:31 AM This report has been signed electronically. Number of Addenda: 0 Note Initiated On: 01/04/2019 10:47 AM Scope Withdrawal Time: 0 hours 16 minutes 6 seconds  Total Procedure Duration: 0 hours 19 minutes 5 seconds  Estimated Blood Loss:  Estimated blood loss: none.      [facility]

## 2019-01-07 ENCOUNTER — Encounter: Payer: Self-pay | Admitting: *Deleted

## 2019-01-09 ENCOUNTER — Encounter: Payer: Self-pay | Admitting: Gastroenterology

## 2019-08-11 IMAGING — DX DG KNEE 1-2V PORT*L*
2 series · 2 of 2 positions shown · non-contrast
Comparison: CT 02/14/2017

CLINICAL DATA: Left knee surgery

EXAM:
PORTABLE LEFT KNEE - 1-2 VIEW

[knee ap]
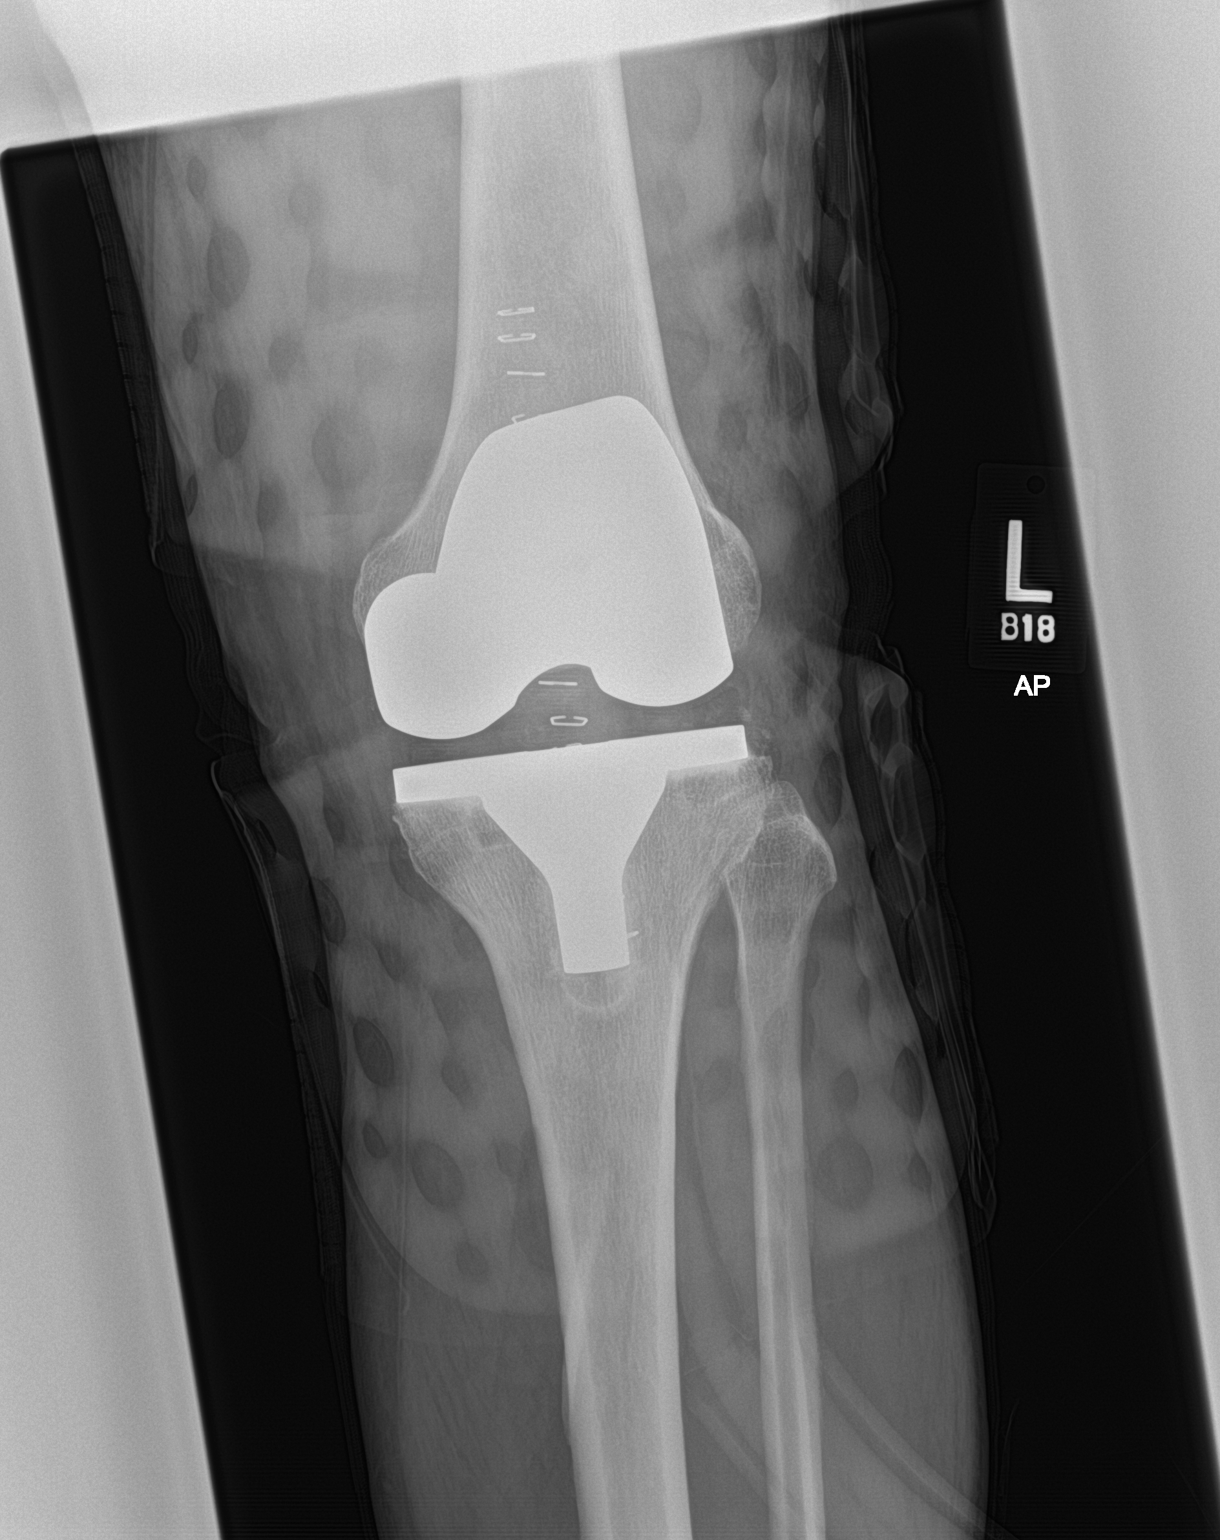

[knee lat]
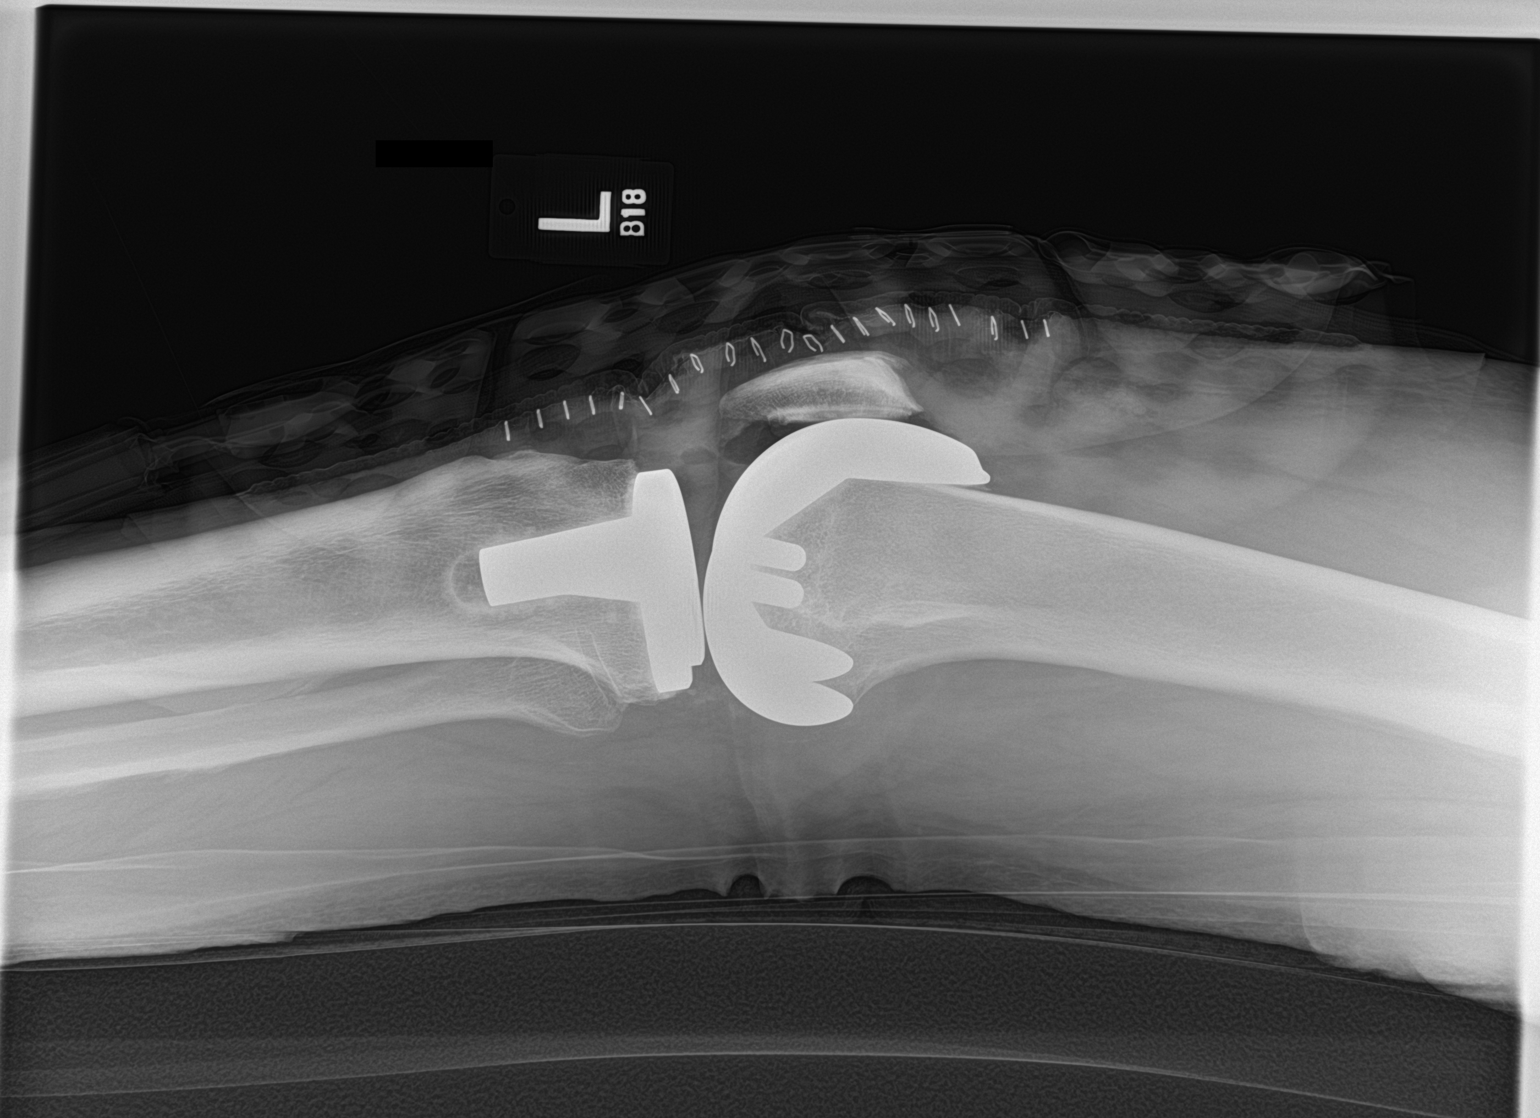

[2 of 2 positions shown; findings below may reference images not displayed]

FINDINGS: Interval left knee arthroplasty, components projecting in expected
location. No fracture or dislocation. Anterior skin staples.
IMPRESSION: Left knee arthroplasty without apparent complication

## 2022-04-26 ENCOUNTER — Telehealth (HOSPITAL_COMMUNITY): Payer: Self-pay | Admitting: Emergency Medicine

## 2022-04-26 ENCOUNTER — Other Ambulatory Visit: Payer: Self-pay | Admitting: Internal Medicine

## 2022-04-26 DIAGNOSIS — R0789 Other chest pain: Secondary | ICD-10-CM

## 2022-04-26 DIAGNOSIS — I2089 Other forms of angina pectoris: Secondary | ICD-10-CM

## 2022-04-26 DIAGNOSIS — R079 Chest pain, unspecified: Secondary | ICD-10-CM

## 2022-04-26 MED ORDER — METOPROLOL TARTRATE 100 MG PO TABS: 100.0000 mg | ORAL_TABLET | Freq: Once | ORAL | 0 refills | Status: DC

## 2022-04-26 NOTE — Telephone Encounter (Signed)
Reaching out to patient to offer assistance regarding upcoming cardiac imaging study; pt verbalizes understanding of appt date/time, parking situation and where to check in, pre-test NPO status and medications ordered, and verified current allergies; name and call back number provided for further questions should they arise Thomas Bond RN Navigator Cardiac Imaging [facility] [phone] office [phone] cell  [place] Denies iv issues 100mg  metoprolol tartrate Aware contrast/nitro

## 2022-04-28 ENCOUNTER — Ambulatory Visit
Admission: RE | Admit: 2022-04-28 | Discharge: 2022-04-28 | Disposition: A | Discharge status: Home / Self Care | Payer: Medicare HMO | Source: Ambulatory Visit | Attending: Internal Medicine | Admitting: Internal Medicine

## 2022-04-28 ENCOUNTER — Other Ambulatory Visit
Admission: RE | Admit: 2022-04-28 | Discharge: 2022-04-28 | Disposition: A | Discharge status: Home / Self Care | Payer: Medicare HMO | Source: Ambulatory Visit | Attending: Internal Medicine | Admitting: Internal Medicine

## 2022-04-28 DIAGNOSIS — I2089 Other forms of angina pectoris: Secondary | ICD-10-CM | POA: Insufficient documentation

## 2022-04-28 DIAGNOSIS — R0789 Other chest pain: Secondary | ICD-10-CM | POA: Diagnosis not present

## 2022-04-28 DIAGNOSIS — I480 Paroxysmal atrial fibrillation: Secondary | ICD-10-CM | POA: Insufficient documentation

## 2022-04-28 DIAGNOSIS — Z79899 Other long term (current) drug therapy: Secondary | ICD-10-CM | POA: Insufficient documentation

## 2022-04-28 DIAGNOSIS — I10 Essential (primary) hypertension: Secondary | ICD-10-CM | POA: Insufficient documentation

## 2022-04-28 LAB — BRAIN NATRIURETIC PEPTIDE: B Natriuretic Peptide: 231.2 pg/mL — ABNORMAL HIGH (ref 0.0–100.0)

## 2022-04-28 MED ORDER — NITROGLYCERIN 0.4 MG SL SUBL
0.4000 mg | SUBLINGUAL_TABLET | Freq: Once | SUBLINGUAL | Status: AC
  Administered 2022-04-28: 0.4 mg via SUBLINGUAL

## 2022-04-28 MED ORDER — IOHEXOL 350 MG/ML SOLN
75.0000 mL | Freq: Once | INTRAVENOUS | Status: AC | PRN
  Administered 2022-04-28: 75 mL via INTRAVENOUS

## 2022-04-28 NOTE — Progress Notes (Signed)
Patient tolerated CT well.Vital signs stable encourage to drink water throughout day.Reasons explained and verbalized understanding. Ambulated steady gait.   

## 2022-05-11 ENCOUNTER — Other Ambulatory Visit: Payer: Self-pay | Admitting: Internal Medicine

## 2022-05-11 DIAGNOSIS — R0602 Shortness of breath: Secondary | ICD-10-CM

## 2022-05-17 ENCOUNTER — Ambulatory Visit: Admission: RE | Admit: 2022-05-17 | Payer: Medicare HMO | Source: Home / Self Care | Admitting: Internal Medicine

## 2022-05-17 ENCOUNTER — Encounter: Admission: RE | Payer: Self-pay | Source: Home / Self Care

## 2022-05-17 SURGERY — LEFT HEART CATH AND CORONARY ANGIOGRAPHY
Anesthesia: Moderate Sedation | Laterality: Left

## 2022-08-02 ENCOUNTER — Encounter (HOSPITAL_COMMUNITY): Payer: Self-pay

## 2022-08-03 ENCOUNTER — Ambulatory Visit
Admission: RE | Admit: 2022-08-03 | Discharge: 2022-08-03 | Disposition: A | Discharge status: Home / Self Care | Payer: Medicare HMO | Source: Ambulatory Visit | Attending: Internal Medicine | Admitting: Internal Medicine

## 2022-08-03 DIAGNOSIS — R0602 Shortness of breath: Secondary | ICD-10-CM | POA: Insufficient documentation

## 2022-08-03 MED ORDER — GADOBUTROL 1 MMOL/ML IV SOLN
11.0000 mL | Freq: Once | INTRAVENOUS | Status: AC | PRN
  Administered 2022-08-03: 11 mL via INTRAVENOUS

## 2022-12-21 ENCOUNTER — Other Ambulatory Visit: Payer: Self-pay | Admitting: Nephrology

## 2022-12-21 DIAGNOSIS — N1832 Chronic kidney disease, stage 3b: Secondary | ICD-10-CM

## 2022-12-29 ENCOUNTER — Ambulatory Visit
Admission: RE | Admit: 2022-12-29 | Discharge: 2022-12-29 | Disposition: A | Discharge status: Home / Self Care | Payer: Medicare HMO | Source: Ambulatory Visit | Attending: Nephrology | Admitting: Nephrology

## 2022-12-29 DIAGNOSIS — N1832 Chronic kidney disease, stage 3b: Secondary | ICD-10-CM | POA: Insufficient documentation

## 2023-06-05 ENCOUNTER — Ambulatory Visit
Admission: RE | Admit: 2023-06-05 | Discharge: 2023-06-05 | Disposition: A | Discharge status: Home / Self Care | Attending: Internal Medicine | Admitting: Internal Medicine

## 2023-06-05 ENCOUNTER — Ambulatory Visit: Admitting: Certified Registered Nurse Anesthetist

## 2023-06-05 ENCOUNTER — Encounter
Admission: RE | Disposition: A | Discharge status: Home / Self Care | Payer: Self-pay | Source: Home / Self Care | Attending: Internal Medicine

## 2023-06-05 ENCOUNTER — Encounter: Payer: Self-pay | Admitting: Internal Medicine

## 2023-06-05 DIAGNOSIS — I4891 Unspecified atrial fibrillation: Secondary | ICD-10-CM | POA: Insufficient documentation

## 2023-06-05 DIAGNOSIS — I10 Essential (primary) hypertension: Secondary | ICD-10-CM | POA: Diagnosis not present

## 2023-06-05 DIAGNOSIS — I4819 Other persistent atrial fibrillation: Secondary | ICD-10-CM

## 2023-06-05 HISTORY — PX: CARDIOVERSION: SHX1299

## 2023-06-05 SURGERY — CARDIOVERSION
Anesthesia: General

## 2023-06-05 MED ORDER — PROPOFOL 10 MG/ML IV BOLUS
INTRAVENOUS | Status: AC
  Filled 2023-06-05: qty 20

## 2023-06-05 MED ORDER — PROPOFOL 10 MG/ML IV BOLUS
INTRAVENOUS | Status: DC | PRN
  Administered 2023-06-05: 40 mg via INTRAVENOUS

## 2023-06-05 MED ORDER — SODIUM CHLORIDE 0.9 % IV SOLN
INTRAVENOUS | Status: DC | PRN
Start: 2023-06-05 — End: 2023-06-05

## 2023-06-05 NOTE — Transfer of Care (Signed)
 Immediate Anesthesia Transfer of Care Note  Patient: Thomas Newman  Procedure(s) Performed: CARDIOVERSION  Patient Location: specials cardiology  Anesthesia Type:General  Level of Consciousness: awake, alert , and oriented  Airway & Oxygen Therapy: Patient Spontanous Breathing and Patient connected to nasal cannula oxygen  Post-op Assessment: Report given to RN and Post -op Vital signs reviewed and stable  Post vital signs: Reviewed and stable  Last Vitals:  Vitals Value Taken Time  BP 112/70 06/05/23 0751  Temp    Pulse 68 06/05/23 0751  Resp 12 06/05/23 0751  SpO2 97 % 06/05/23 0751    Last Pain:  Vitals:   06/05/23 0714  TempSrc: Oral  PainSc: 0-No pain         Complications: No notable events documented.

## 2023-06-05 NOTE — Anesthesia Preprocedure Evaluation (Signed)
 Anesthesia Evaluation  Patient identified by MRN, date of birth, ID band Patient awake    Reviewed: Allergy & Precautions, H&P , NPO status , Patient's Chart, lab work & pertinent test results  Airway Mallampati: III  TM Distance: >3 FB Neck ROM: full    Dental no notable dental hx. (+) Chipped, Dental Advidsory Given   Pulmonary neg pulmonary ROS   Pulmonary exam normal breath sounds clear to auscultation       Cardiovascular hypertension, negative cardio ROS Normal cardiovascular exam+ dysrhythmias Atrial Fibrillation  Rhythm:regular Rate:Normal     Neuro/Psych CVA negative neurological ROS  negative psych ROS   GI/Hepatic negative GI ROS, Neg liver ROS,,,  Endo/Other  negative endocrine ROS    Renal/GU negative Renal ROS  negative genitourinary   Musculoskeletal   Abdominal   Peds  Hematology negative hematology ROS (+)   Anesthesia Other Findings Past Medical History: No date: Anemia No date: Anginal pain ([facility]) No date: Arthritis No date: Atrial fibrillation ([facility]) No date: Dyspnea     Comment:  WORKUP NO CAUSE FOUND No date: Dysrhythmia     Comment:  Afib No date: Hypertension No date: Stroke [facility])     Comment:  POSSIBLE No residual effects  Past Surgical History: No date: CARPAL TUNNEL RELEASE 05/03/2016: CATARACT EXTRACTION W/PHACO; Right     Comment:  Procedure: CATARACT EXTRACTION PHACO AND INTRAOCULAR               LENS PLACEMENT (IOC);  Surgeon: Clair Crews, MD;                Location: [facility] ORS;  Service: Ophthalmology;  Laterality:              Right;  US  00:35 AP% 14.2 CDE 5.04 fluid pack lot #               [id number] 01/04/2019: COLONOSCOPY WITH PROPOFOL ; N/A     Comment:  Procedure: COLONOSCOPY WITH PROPOFOL ;  Surgeon:               Irby Mannan, MD;  Location: [facility];              Service: Endoscopy;  Laterality: N/A; 2000: HERNIA REPAIR; Left     Comment:   inguinal 05/15/2017: TOTAL KNEE ARTHROPLASTY; Left     Comment:  Procedure: TOTAL KNEE ARTHROPLASTY;  Surgeon: Jerlyn Moons, MD;  Location: [facility] ORS;  Service: Orthopedics;               Laterality: Left; 07/17/2017: TRANSURETHRAL RESECTION OF PROSTATE; N/A     Comment:  Procedure: TRANSURETHRAL RESECTION OF THE PROSTATE               (TURP);  Surgeon: Dustin Gimenez, MD;  Location: [facility]               ORS;  Service: Urology;  Laterality: N/A;  BMI    Body Mass Index: 28.13 kg/m      Reproductive/Obstetrics negative OB ROS                             Anesthesia Physical Anesthesia Plan  ASA: 3  Anesthesia Plan: General   Post-op Pain Management: Minimal or no pain anticipated   Induction: Intravenous  PONV Risk Score and Plan: 2 and Propofol  infusion and TIVA  Airway Management  Planned: Nasal Cannula  Additional Equipment: None  Intra-op Plan:   Post-operative Plan:   Informed Consent: I have reviewed the patients History and Physical, chart, labs and discussed the procedure including the risks, benefits and alternatives for the proposed anesthesia with the patient or authorized representative who has indicated his/her understanding and acceptance.     Dental advisory given  Plan Discussed with: CRNA and Surgeon  Anesthesia Plan Comments: (Discussed risks of anesthesia with patient, including possibility of difficulty with spontaneous ventilation under anesthesia necessitating airway intervention, PONV, and rare risks such as cardiac or respiratory or neurological events, and allergic reactions. Discussed the role of CRNA in patient's perioperative care. Patient understands.)       Anesthesia Quick Evaluation

## 2023-06-05 NOTE — Anesthesia Postprocedure Evaluation (Signed)
 Anesthesia Post Note  Patient: Thomas Newman  Procedure(s) Performed: CARDIOVERSION  Patient location during evaluation: PACU Anesthesia Type: General Level of consciousness: awake and alert Pain management: pain level controlled Vital Signs Assessment: post-procedure vital signs reviewed and stable Respiratory status: spontaneous breathing, nonlabored ventilation, respiratory function stable and patient connected to nasal cannula oxygen Cardiovascular status: blood pressure returned to baseline and stable Postop Assessment: no apparent nausea or vomiting Anesthetic complications: no  No notable events documented.   Last Vitals:  Vitals:   06/05/23 0800 06/05/23 0815  BP: 112/71 120/72  Pulse: 64 64  Resp: 17 18  Temp:    SpO2: 98% 99%    Last Pain:  Vitals:   06/05/23 0815  TempSrc:   PainSc: 0-No pain                 Enrique Harvest

## 2023-06-05 NOTE — Anesthesia Procedure Notes (Signed)
 Date/Time: 06/05/2023 7:35 AM  Performed by: Angelia Kelp, CRNAPre-anesthesia Checklist: Patient identified, Emergency Drugs available, Suction available, Patient being monitored and Timeout performed Patient Re-evaluated:Patient Re-evaluated prior to induction Oxygen Delivery Method: Nasal cannula Preoxygenation: Pre-oxygenation with 100% oxygen Induction Type: IV induction

## 2023-06-05 NOTE — CV Procedure (Signed)
 Electrical Cardioversion Procedure Note   Procedure: Electrical Cardioversion Indications:  Atrial Fibrillation  Procedure Details Consent: Risks of procedure as well as the alternatives and risks of each were explained to the (patient/caregiver).  Consent for procedure obtained. Time Out: Verified patient identification, verified procedure, site/side was marked, verified correct patient position, special equipment/implants available, medications/allergies/relevent history reviewed, required imaging and test results available.  Performed  Patient placed on cardiac monitor, pulse oximetry, supplemental oxygen as necessary.  Sedation given: Propofol  as per anesthesia Pacer pads placed anterior and posterior chest.  Cardioverted 1 time(s).  Cardioverted at 200J.  Evaluation Findings: Post procedure EKG shows: NSR Complications: None Patient did tolerate procedure well.   Burney Carter MD 06/05/2023 07:45a

## 2023-06-06 ENCOUNTER — Encounter: Payer: Self-pay | Admitting: Internal Medicine

## 2024-04-23 ENCOUNTER — Ambulatory Visit: Admitting: Gastroenterology
